# Patient Record
Sex: Female | Born: 1955
Health system: Southern US, Community
[De-identification: ages and names within clinical notes are randomized; demographics above are authoritative.]

## PROBLEM LIST (undated history)

## (undated) DIAGNOSIS — C801 Malignant (primary) neoplasm, unspecified: Secondary | ICD-10-CM

## (undated) DIAGNOSIS — R32 Unspecified urinary incontinence: Secondary | ICD-10-CM

## (undated) DIAGNOSIS — Z923 Personal history of irradiation: Secondary | ICD-10-CM

## (undated) DIAGNOSIS — K219 Gastro-esophageal reflux disease without esophagitis: Secondary | ICD-10-CM

## (undated) DIAGNOSIS — M810 Age-related osteoporosis without current pathological fracture: Secondary | ICD-10-CM

## (undated) DIAGNOSIS — I10 Essential (primary) hypertension: Secondary | ICD-10-CM

## (undated) DIAGNOSIS — E119 Type 2 diabetes mellitus without complications: Secondary | ICD-10-CM

## (undated) DIAGNOSIS — E7800 Pure hypercholesterolemia, unspecified: Secondary | ICD-10-CM

## (undated) HISTORY — DX: Type 2 diabetes mellitus without complications: E11.9

## (undated) HISTORY — DX: Age-related osteoporosis without current pathological fracture: M81.0

## (undated) HISTORY — DX: Malignant (primary) neoplasm, unspecified: C80.1

## (undated) HISTORY — DX: Unspecified urinary incontinence: R32

## (undated) HISTORY — DX: Pure hypercholesterolemia, unspecified: E78.00

## (undated) HISTORY — DX: Essential (primary) hypertension: I10

---

## 1999-04-18 ENCOUNTER — Other Ambulatory Visit: Admission: RE | Admit: 1999-04-18 | Discharge: 1999-04-18 | Payer: Self-pay | Admitting: *Deleted

## 1999-06-04 ENCOUNTER — Ambulatory Visit (HOSPITAL_COMMUNITY): Admission: RE | Admit: 1999-06-04 | Discharge: 1999-06-04 | Payer: Self-pay | Admitting: *Deleted

## 1999-06-04 ENCOUNTER — Encounter: Payer: Self-pay | Admitting: *Deleted

## 2000-07-11 ENCOUNTER — Ambulatory Visit (HOSPITAL_COMMUNITY): Admission: RE | Admit: 2000-07-11 | Discharge: 2000-07-11 | Payer: Self-pay | Admitting: Internal Medicine

## 2000-07-11 ENCOUNTER — Encounter: Payer: Self-pay | Admitting: Internal Medicine

## 2001-08-04 ENCOUNTER — Ambulatory Visit (HOSPITAL_COMMUNITY): Admission: RE | Admit: 2001-08-04 | Discharge: 2001-08-04 | Payer: Self-pay | Admitting: Internal Medicine

## 2001-08-04 ENCOUNTER — Encounter: Payer: Self-pay | Admitting: Internal Medicine

## 2001-08-24 ENCOUNTER — Ambulatory Visit (HOSPITAL_COMMUNITY): Admission: RE | Admit: 2001-08-24 | Discharge: 2001-08-24 | Payer: Self-pay | Admitting: Internal Medicine

## 2001-09-16 ENCOUNTER — Other Ambulatory Visit: Admission: RE | Admit: 2001-09-16 | Discharge: 2001-09-16 | Payer: Self-pay | Admitting: *Deleted

## 2002-08-06 ENCOUNTER — Ambulatory Visit (HOSPITAL_COMMUNITY): Admission: RE | Admit: 2002-08-06 | Discharge: 2002-08-06 | Payer: Self-pay | Admitting: Internal Medicine

## 2002-08-06 ENCOUNTER — Encounter: Payer: Self-pay | Admitting: Internal Medicine

## 2002-10-11 ENCOUNTER — Other Ambulatory Visit: Admission: RE | Admit: 2002-10-11 | Discharge: 2002-10-11 | Payer: Self-pay | Admitting: *Deleted

## 2003-08-15 ENCOUNTER — Encounter: Payer: Self-pay | Admitting: Internal Medicine

## 2003-08-15 ENCOUNTER — Ambulatory Visit (HOSPITAL_COMMUNITY): Admission: RE | Admit: 2003-08-15 | Discharge: 2003-08-15 | Payer: Self-pay | Admitting: Internal Medicine

## 2003-10-29 DIAGNOSIS — C801 Malignant (primary) neoplasm, unspecified: Secondary | ICD-10-CM

## 2003-10-29 DIAGNOSIS — Z923 Personal history of irradiation: Secondary | ICD-10-CM

## 2003-10-29 HISTORY — DX: Personal history of irradiation: Z92.3

## 2003-10-29 HISTORY — DX: Malignant (primary) neoplasm, unspecified: C80.1

## 2003-10-29 HISTORY — PX: BREAST LUMPECTOMY: SHX2

## 2003-10-29 HISTORY — PX: BREAST SURGERY: SHX581

## 2004-03-27 ENCOUNTER — Other Ambulatory Visit: Admission: RE | Admit: 2004-03-27 | Discharge: 2004-03-27 | Payer: Self-pay | Admitting: *Deleted

## 2004-08-28 ENCOUNTER — Ambulatory Visit (HOSPITAL_COMMUNITY): Admission: RE | Admit: 2004-08-28 | Discharge: 2004-08-28 | Payer: Self-pay | Admitting: Internal Medicine

## 2004-09-05 ENCOUNTER — Encounter: Admission: RE | Admit: 2004-09-05 | Discharge: 2004-09-05 | Payer: Self-pay | Admitting: Internal Medicine

## 2004-09-11 ENCOUNTER — Encounter (HOSPITAL_COMMUNITY): Admission: RE | Admit: 2004-09-11 | Discharge: 2004-12-10 | Payer: Self-pay | Admitting: Internal Medicine

## 2004-09-13 ENCOUNTER — Encounter: Admission: RE | Admit: 2004-09-13 | Discharge: 2004-09-13 | Payer: Self-pay | Admitting: Internal Medicine

## 2004-09-13 ENCOUNTER — Encounter (INDEPENDENT_AMBULATORY_CARE_PROVIDER_SITE_OTHER): Payer: Self-pay | Admitting: Radiology

## 2004-09-13 ENCOUNTER — Encounter (INDEPENDENT_AMBULATORY_CARE_PROVIDER_SITE_OTHER): Payer: Self-pay | Admitting: Specialist

## 2004-10-03 ENCOUNTER — Encounter: Admission: RE | Admit: 2004-10-03 | Discharge: 2004-10-03 | Payer: Self-pay | Admitting: General Surgery

## 2004-10-05 ENCOUNTER — Ambulatory Visit (HOSPITAL_BASED_OUTPATIENT_CLINIC_OR_DEPARTMENT_OTHER): Admission: RE | Admit: 2004-10-05 | Discharge: 2004-10-05 | Payer: Self-pay | Admitting: General Surgery

## 2004-10-05 ENCOUNTER — Encounter: Admission: RE | Admit: 2004-10-05 | Discharge: 2004-10-05 | Payer: Self-pay | Admitting: General Surgery

## 2004-10-05 ENCOUNTER — Encounter (INDEPENDENT_AMBULATORY_CARE_PROVIDER_SITE_OTHER): Payer: Self-pay | Admitting: *Deleted

## 2004-10-05 ENCOUNTER — Ambulatory Visit (HOSPITAL_COMMUNITY): Admission: RE | Admit: 2004-10-05 | Discharge: 2004-10-05 | Payer: Self-pay | Admitting: General Surgery

## 2004-10-12 ENCOUNTER — Ambulatory Visit: Payer: Self-pay | Admitting: Oncology

## 2004-10-16 ENCOUNTER — Ambulatory Visit (HOSPITAL_BASED_OUTPATIENT_CLINIC_OR_DEPARTMENT_OTHER): Admission: RE | Admit: 2004-10-16 | Discharge: 2004-10-16 | Payer: Self-pay | Admitting: General Surgery

## 2004-10-16 ENCOUNTER — Ambulatory Visit (HOSPITAL_COMMUNITY): Admission: RE | Admit: 2004-10-16 | Discharge: 2004-10-16 | Payer: Self-pay | Admitting: General Surgery

## 2004-10-16 ENCOUNTER — Encounter (INDEPENDENT_AMBULATORY_CARE_PROVIDER_SITE_OTHER): Payer: Self-pay | Admitting: Specialist

## 2004-11-05 ENCOUNTER — Ambulatory Visit (HOSPITAL_COMMUNITY): Admission: RE | Admit: 2004-11-05 | Discharge: 2004-11-05 | Payer: Self-pay | Admitting: Oncology

## 2004-11-06 ENCOUNTER — Ambulatory Visit (HOSPITAL_COMMUNITY): Admission: RE | Admit: 2004-11-06 | Discharge: 2004-11-06 | Payer: Self-pay | Admitting: Oncology

## 2004-11-09 ENCOUNTER — Ambulatory Visit: Admission: RE | Admit: 2004-11-09 | Discharge: 2005-01-18 | Payer: Self-pay | Admitting: Radiation Oncology

## 2004-12-24 ENCOUNTER — Encounter: Admission: RE | Admit: 2004-12-24 | Discharge: 2004-12-24 | Payer: Self-pay | Admitting: Internal Medicine

## 2005-02-18 ENCOUNTER — Ambulatory Visit: Payer: Self-pay | Admitting: Oncology

## 2005-02-21 ENCOUNTER — Ambulatory Visit: Admission: RE | Admit: 2005-02-21 | Discharge: 2005-02-21 | Payer: Self-pay | Admitting: Radiation Oncology

## 2005-08-26 ENCOUNTER — Ambulatory Visit: Payer: Self-pay | Admitting: Oncology

## 2005-08-30 ENCOUNTER — Encounter: Admission: RE | Admit: 2005-08-30 | Discharge: 2005-08-30 | Payer: Self-pay | Admitting: General Surgery

## 2005-09-11 ENCOUNTER — Encounter: Admission: RE | Admit: 2005-09-11 | Discharge: 2005-09-11 | Payer: Self-pay | Admitting: General Surgery

## 2005-09-11 ENCOUNTER — Ambulatory Visit (HOSPITAL_COMMUNITY): Admission: RE | Admit: 2005-09-11 | Discharge: 2005-09-11 | Payer: Self-pay | Admitting: Oncology

## 2006-02-20 ENCOUNTER — Ambulatory Visit: Payer: Self-pay | Admitting: Oncology

## 2006-02-21 LAB — CBC WITH DIFFERENTIAL/PLATELET
Basophils Absolute: 0 10*3/uL (ref 0.0–0.1)
EOS%: 2.1 % (ref 0.0–7.0)
Eosinophils Absolute: 0.1 10*3/uL (ref 0.0–0.5)
LYMPH%: 26.8 % (ref 14.0–48.0)
MCH: 32.3 pg (ref 26.0–34.0)
MCV: 92.7 fL (ref 81.0–101.0)
MONO%: 7.2 % (ref 0.0–13.0)
NEUT#: 3.2 10*3/uL (ref 1.5–6.5)
Platelets: 225 10*3/uL (ref 145–400)
RBC: 4.34 10*6/uL (ref 3.70–5.32)
RDW: 12.1 % (ref 11.3–14.5)

## 2006-02-21 LAB — COMPREHENSIVE METABOLIC PANEL
AST: 27 U/L (ref 0–37)
Alkaline Phosphatase: 50 U/L (ref 39–117)
BUN: 13 mg/dL (ref 6–23)
Glucose, Bld: 102 mg/dL — ABNORMAL HIGH (ref 70–99)
Potassium: 4 mEq/L (ref 3.5–5.3)
Sodium: 139 mEq/L (ref 135–145)
Total Bilirubin: 0.6 mg/dL (ref 0.3–1.2)

## 2006-09-05 ENCOUNTER — Ambulatory Visit: Payer: Self-pay | Admitting: Oncology

## 2006-09-09 LAB — COMPREHENSIVE METABOLIC PANEL
AST: 26 U/L (ref 0–37)
Albumin: 3.8 g/dL (ref 3.5–5.2)
Alkaline Phosphatase: 62 U/L (ref 39–117)
Potassium: 4.1 mEq/L (ref 3.5–5.3)
Sodium: 141 mEq/L (ref 135–145)
Total Bilirubin: 0.4 mg/dL (ref 0.3–1.2)
Total Protein: 6.8 g/dL (ref 6.0–8.3)

## 2006-09-09 LAB — CBC WITH DIFFERENTIAL/PLATELET
Basophils Absolute: 0 10*3/uL (ref 0.0–0.1)
EOS%: 2.1 % (ref 0.0–7.0)
MCH: 31.4 pg (ref 26.0–34.0)
MCV: 91.7 fL (ref 81.0–101.0)
MONO%: 6.9 % (ref 0.0–13.0)
RBC: 4.42 10*6/uL (ref 3.70–5.32)
RDW: 11.9 % (ref 11.3–14.5)

## 2006-09-09 LAB — CANCER ANTIGEN 27.29: CA 27.29: 15 U/mL (ref 0–39)

## 2006-09-25 ENCOUNTER — Encounter: Admission: RE | Admit: 2006-09-25 | Discharge: 2006-09-25 | Payer: Self-pay | Admitting: General Surgery

## 2007-03-06 ENCOUNTER — Ambulatory Visit: Payer: Self-pay | Admitting: Oncology

## 2007-03-10 LAB — CBC WITH DIFFERENTIAL/PLATELET
BASO%: 0.4 % (ref 0.0–2.0)
EOS%: 2.6 % (ref 0.0–7.0)
LYMPH%: 23.2 % (ref 14.0–48.0)
MCH: 31.9 pg (ref 26.0–34.0)
MCHC: 35.1 g/dL (ref 32.0–36.0)
MCV: 90.8 fL (ref 81.0–101.0)
MONO#: 0.3 10*3/uL (ref 0.1–0.9)
MONO%: 5.8 % (ref 0.0–13.0)
NEUT%: 68 % (ref 39.6–76.8)
Platelets: 236 10*3/uL (ref 145–400)
RBC: 4.39 10*6/uL (ref 3.70–5.32)
WBC: 5.9 10*3/uL (ref 3.9–10.0)

## 2007-03-10 LAB — COMPREHENSIVE METABOLIC PANEL
ALT: 27 U/L (ref 0–35)
AST: 27 U/L (ref 0–37)
Alkaline Phosphatase: 59 U/L (ref 39–117)
Creatinine, Ser: 0.49 mg/dL (ref 0.40–1.20)
Sodium: 140 mEq/L (ref 135–145)
Total Bilirubin: 0.4 mg/dL (ref 0.3–1.2)
Total Protein: 6.6 g/dL (ref 6.0–8.3)

## 2007-09-03 ENCOUNTER — Ambulatory Visit: Payer: Self-pay | Admitting: Oncology

## 2007-09-07 LAB — COMPREHENSIVE METABOLIC PANEL
ALT: 33 U/L (ref 0–35)
AST: 31 U/L (ref 0–37)
BUN: 11 mg/dL (ref 6–23)
CO2: 22 mEq/L (ref 19–32)
Calcium: 9.3 mg/dL (ref 8.4–10.5)
Chloride: 106 mEq/L (ref 96–112)
Creatinine, Ser: 0.54 mg/dL (ref 0.40–1.20)
Total Bilirubin: 0.5 mg/dL (ref 0.3–1.2)

## 2007-09-07 LAB — CBC WITH DIFFERENTIAL/PLATELET
BASO%: 0.9 % (ref 0.0–2.0)
Basophils Absolute: 0 10*3/uL (ref 0.0–0.1)
EOS%: 3 % (ref 0.0–7.0)
HCT: 40.8 % (ref 34.8–46.6)
HGB: 14.6 g/dL (ref 11.6–15.9)
LYMPH%: 25.8 % (ref 14.0–48.0)
MCH: 31.9 pg (ref 26.0–34.0)
MCHC: 35.7 g/dL (ref 32.0–36.0)
NEUT%: 64.5 % (ref 39.6–76.8)
Platelets: 234 10*3/uL (ref 145–400)
lymph#: 1.4 10*3/uL (ref 0.9–3.3)

## 2007-09-07 LAB — CANCER ANTIGEN 27.29: CA 27.29: 18 U/mL (ref 0–39)

## 2007-09-10 LAB — VITAMIN D PNL(25-HYDRXY+1,25-DIHY)-BLD: Vit D, 1,25-Dihydroxy: 88 pg/mL — ABNORMAL HIGH (ref 6–62)

## 2007-09-28 ENCOUNTER — Encounter: Admission: RE | Admit: 2007-09-28 | Discharge: 2007-09-28 | Payer: Self-pay | Admitting: Surgery

## 2007-10-04 ENCOUNTER — Encounter: Admission: RE | Admit: 2007-10-04 | Discharge: 2007-10-04 | Payer: Self-pay | Admitting: Surgery

## 2008-03-10 ENCOUNTER — Ambulatory Visit: Payer: Self-pay | Admitting: Oncology

## 2008-03-14 LAB — CBC WITH DIFFERENTIAL/PLATELET
Basophils Absolute: 0 10*3/uL (ref 0.0–0.1)
EOS%: 1.7 % (ref 0.0–7.0)
HCT: 40.7 % (ref 34.8–46.6)
HGB: 14.2 g/dL (ref 11.6–15.9)
MCH: 31.5 pg (ref 26.0–34.0)
MCV: 89.9 fL (ref 81.0–101.0)
MONO%: 5 % (ref 0.0–13.0)
NEUT%: 68 % (ref 39.6–76.8)

## 2008-03-14 LAB — COMPREHENSIVE METABOLIC PANEL
ALT: 22 U/L (ref 0–35)
Alkaline Phosphatase: 56 U/L (ref 39–117)
CO2: 23 mEq/L (ref 19–32)
Sodium: 140 mEq/L (ref 135–145)
Total Bilirubin: 0.4 mg/dL (ref 0.3–1.2)
Total Protein: 7.1 g/dL (ref 6.0–8.3)

## 2008-09-28 ENCOUNTER — Encounter: Admission: RE | Admit: 2008-09-28 | Discharge: 2008-09-28 | Payer: Self-pay | Admitting: Oncology

## 2009-03-13 ENCOUNTER — Ambulatory Visit: Payer: Self-pay | Admitting: Oncology

## 2009-03-15 LAB — CBC WITH DIFFERENTIAL/PLATELET
Eosinophils Absolute: 0.1 10*3/uL (ref 0.0–0.5)
MCV: 91.2 fL (ref 79.5–101.0)
MONO#: 0.3 10*3/uL (ref 0.1–0.9)
MONO%: 4.8 % (ref 0.0–14.0)
NEUT#: 4.6 10*3/uL (ref 1.5–6.5)
RBC: 4.55 10*6/uL (ref 3.70–5.45)
RDW: 12.2 % (ref 11.2–14.5)
WBC: 6.5 10*3/uL (ref 3.9–10.3)
lymph#: 1.4 10*3/uL (ref 0.9–3.3)

## 2009-03-16 LAB — COMPREHENSIVE METABOLIC PANEL
AST: 22 U/L (ref 0–37)
Albumin: 4 g/dL (ref 3.5–5.2)
Alkaline Phosphatase: 64 U/L (ref 39–117)
Chloride: 106 mEq/L (ref 96–112)
Glucose, Bld: 120 mg/dL — ABNORMAL HIGH (ref 70–99)
Potassium: 4.1 mEq/L (ref 3.5–5.3)
Sodium: 140 mEq/L (ref 135–145)
Total Protein: 6.9 g/dL (ref 6.0–8.3)

## 2009-03-16 LAB — VITAMIN D 25 HYDROXY (VIT D DEFICIENCY, FRACTURES): Vit D, 25-Hydroxy: 31 ng/mL (ref 30–89)

## 2009-03-30 LAB — RESEARCH LABS

## 2009-09-05 ENCOUNTER — Encounter: Admission: RE | Admit: 2009-09-05 | Discharge: 2009-09-05 | Payer: Self-pay | Admitting: Oncology

## 2009-10-02 ENCOUNTER — Encounter: Admission: RE | Admit: 2009-10-02 | Discharge: 2009-10-02 | Payer: Self-pay | Admitting: Internal Medicine

## 2009-10-26 ENCOUNTER — Ambulatory Visit: Payer: Self-pay | Admitting: Oncology

## 2009-10-31 LAB — CBC WITH DIFFERENTIAL/PLATELET
BASO%: 0.5 % (ref 0.0–2.0)
Basophils Absolute: 0 10*3/uL (ref 0.0–0.1)
EOS%: 2.2 % (ref 0.0–7.0)
Eosinophils Absolute: 0.1 10*3/uL (ref 0.0–0.5)
HCT: 42.6 % (ref 34.8–46.6)
HGB: 14.8 g/dL (ref 11.6–15.9)
LYMPH%: 28.6 % (ref 14.0–49.7)
MCH: 32.3 pg (ref 25.1–34.0)
MCHC: 34.8 g/dL (ref 31.5–36.0)
MCV: 92.7 fL (ref 79.5–101.0)
MONO#: 0.4 10*3/uL (ref 0.1–0.9)
MONO%: 5.9 % (ref 0.0–14.0)
NEUT#: 3.8 10*3/uL (ref 1.5–6.5)
NEUT%: 62.8 % (ref 38.4–76.8)
Platelets: 245 10*3/uL (ref 145–400)
RBC: 4.6 10*6/uL (ref 3.70–5.45)
RDW: 12.6 % (ref 11.2–14.5)
WBC: 6 10*3/uL (ref 3.9–10.3)
lymph#: 1.7 10*3/uL (ref 0.9–3.3)

## 2009-10-31 LAB — RESEARCH LABS

## 2009-11-01 LAB — COMPREHENSIVE METABOLIC PANEL
ALT: 19 U/L (ref 0–35)
AST: 22 U/L (ref 0–37)
Albumin: 4.2 g/dL (ref 3.5–5.2)
Alkaline Phosphatase: 66 U/L (ref 39–117)
BUN: 15 mg/dL (ref 6–23)
CO2: 27 mEq/L (ref 19–32)
Calcium: 9.2 mg/dL (ref 8.4–10.5)
Chloride: 104 mEq/L (ref 96–112)
Creatinine, Ser: 0.45 mg/dL (ref 0.40–1.20)
Glucose, Bld: 100 mg/dL — ABNORMAL HIGH (ref 70–99)
Potassium: 4 mEq/L (ref 3.5–5.3)
Sodium: 141 mEq/L (ref 135–145)
Total Bilirubin: 0.3 mg/dL (ref 0.3–1.2)
Total Protein: 6.8 g/dL (ref 6.0–8.3)

## 2009-11-01 LAB — CANCER ANTIGEN 27.29: CA 27.29: 15 U/mL (ref 0–39)

## 2009-11-01 LAB — VITAMIN D 25 HYDROXY (VIT D DEFICIENCY, FRACTURES): Vit D, 25-Hydroxy: 43 ng/mL (ref 30–89)

## 2009-12-11 ENCOUNTER — Encounter (INDEPENDENT_AMBULATORY_CARE_PROVIDER_SITE_OTHER): Payer: Self-pay | Admitting: *Deleted

## 2010-02-23 ENCOUNTER — Encounter (INDEPENDENT_AMBULATORY_CARE_PROVIDER_SITE_OTHER): Payer: Self-pay | Admitting: *Deleted

## 2010-02-28 ENCOUNTER — Ambulatory Visit: Payer: Self-pay | Admitting: Internal Medicine

## 2010-03-13 ENCOUNTER — Ambulatory Visit: Payer: Self-pay | Admitting: Internal Medicine

## 2010-10-08 ENCOUNTER — Encounter
Admission: RE | Admit: 2010-10-08 | Discharge: 2010-10-08 | Payer: Self-pay | Source: Home / Self Care | Attending: Internal Medicine | Admitting: Internal Medicine

## 2010-11-27 NOTE — Procedures (Signed)
Summary: Colonoscopy  Patient: [name] Note: All result statuses are Final unless otherwise noted.  Tests: (1) Colonoscopy (COL)   COL Colonoscopy           DONE     [facility]     [address]     [place], [place]  [address]           COLONOSCOPY PROCEDURE REPORT           PATIENT:  Andrea [name], Andrea [name]  MR#:  [id number]     BIRTHDATE:  [birth year], 54 yrs. old  GENDER:  female     ENDOSCOPIST:  Hedwig Morton. Juanda Chance, MD     REF. BY:  Chilton Greathouse, M.D.     PROCEDURE DATE:  03/13/2010     PROCEDURE:  Colonoscopy 19147     ASA CLASS:  Class I     INDICATIONS:  Routine Risk Screening     MEDICATIONS:   Versed 9 mg, Fentanyl 100 mcg           DESCRIPTION OF PROCEDURE:   After the risks benefits and     alternatives of the procedure were thoroughly explained, informed     consent was obtained.  Digital rectal exam was performed and     revealed no rectal masses.   The LB PCF-Q180AL [id number] endoscope     was introduced through the anus and advanced to the cecum, which     was identified by both the appendix and ileocecal valve, without     limitations.  The quality of the prep was good, using MiraLax.     The instrument was then slowly withdrawn as the colon was fully     examined.     <<PROCEDUREIMAGES>>           FINDINGS:  Mild diverticulosis was found (see image3).  This was     otherwise a normal examination of the colon (see image1, image2,     and image4).   Retroflexed views in the rectum revealed no     abnormalities.    The scope was then withdrawn from the patient     and the procedure completed.           COMPLICATIONS:  None     ENDOSCOPIC IMPRESSION:     1) Mild diverticulosis     2) Otherwise normal examination     RECOMMENDATIONS:     1) high fiber diet     REPEAT EXAM:  In 10 year(s) for.           ______________________________     Hedwig Morton. Juanda Chance, MD           CC:           n.     eSIGNED:   Hedwig Morton. Uzair Godley at 03/13/2010 11:11 AM           Paden, Kuras, [id number]  Note: An exclamation mark (!) indicates a result that was not dispersed into the flowsheet. Document Creation Date: 03/13/2010 11:12 AM _______________________________________________________________________  (1) Order result status: Final Collection or observation date-time: 03/13/2010 11:07 Requested date-time:  Receipt date-time:  Reported date-time:  Referring Physician:   Ordering Physician: Lina Sar [phone]) Specimen Source:  Source: Launa Grill Order Number: [id number] Lab site:   Appended Document: Colonoscopy    Clinical Lists Changes  Observations: Added new observation of COLONNXTDUE: 02/2020 (03/13/2010 13:03)

## 2010-11-27 NOTE — Procedures (Signed)
Summary: Colonoscopy  Patient: [name] Note: All result statuses are Final unless otherwise noted.  Tests: (1) Colonoscopy (COL)   COL Colonoscopy           DONE     [facility]     [address]     [place], [place]  [address]           COLONOSCOPY PROCEDURE REPORT           PATIENT:  Andrea [name], Andrea [name]  MR#:  [id number]     BIRTHDATE:  09-Dec-1955, 54 yrs. old  GENDER:  female     ENDOSCOPIST:  Hedwig Morton. Juanda Chance, MD     REF. BY:  Chilton Greathouse, M.D.     PROCEDURE DATE:  03/13/2010     PROCEDURE:  Colonoscopy 52841     ASA CLASS:  Class I     INDICATIONS:  Routine Risk Screening     MEDICATIONS:   Versed 9 mg, Fentanyl 100 mcg           DESCRIPTION OF PROCEDURE:   After the risks benefits and     alternatives of the procedure were thoroughly explained, informed     consent was obtained.  Digital rectal exam was performed and     revealed no rectal masses.   The LB PCF-Q180AL [id number] endoscope     was introduced through the anus and advanced to the cecum, which     was identified by both the appendix and ileocecal valve, without     limitations.  The quality of the prep was good, using MiraLax.     The instrument was then slowly withdrawn as the colon was fully     examined.     <<PROCEDUREIMAGES>>           FINDINGS:  Mild diverticulosis was found (see image3).  This was     otherwise a normal examination of the colon (see image1, image2,     and image4).   Retroflexed views in the rectum revealed no     abnormalities.    The scope was then withdrawn from the patient     and the procedure completed.           COMPLICATIONS:  None     ENDOSCOPIC IMPRESSION:     1) Mild diverticulosis     2) Otherwise normal examination     RECOMMENDATIONS:     1) high fiber diet     REPEAT EXAM:  In 10 year(s) for.           ______________________________     Hedwig Morton. Juanda Chance, MD           CC:           n.     eSIGNED:   Hedwig Morton. Arissa Fagin at 03/13/2010 11:11 AM           Zuriel, Yeaman, [id number]  Note: An exclamation mark (!) indicates a result that was not dispersed into the flowsheet. Document Creation Date: 03/15/2010 5:07 PM _______________________________________________________________________  (1) Order result status: Final Collection or observation date-time: 03/13/2010 11:07 Requested date-time:  Receipt date-time:  Reported date-time:  Referring Physician:   Ordering Physician: Lina Sar [phone]) Specimen Source:  Source: Launa Grill Order Number: [id number] Lab site:

## 2010-11-27 NOTE — Letter (Signed)
Summary: [name] Instructions  [facility]  [address] [place], [place] [address]   Phone: [phone]  Fax: [phone]       Andrea [name]    03-Feb-1956    MRN: [id number]       Procedure Day [name]:  [name]  03/13/10     Arrival Time: 9:30AM     Procedure Time:  10:30AM     Location of Procedure:                    _ X_  [facility] (4th Floor)    PREPARATION FOR COLONOSCOPY WITH MIRALAX  Starting 5 days prior to your procedure 03/08/10 do not eat nuts, seeds, popcorn, corn, beans, peas,  salads, or any raw vegetables.  Do not take any fiber supplements (e.g. Metamucil, Citrucel, and Benefiber). ____________________________________________________________________________________________________   THE DAY BEFORE YOUR PROCEDURE         DATE: 03/12/10   DAY: MONDAY  1   Drink clear liquids the entire day-NO SOLID FOOD  2   Do not drink anything colored red or purple.  Avoid juices with pulp.  No orange juice.  3   Drink at least 64 oz. (8 glasses) of fluid/clear liquids during the day to prevent dehydration and help the prep work efficiently.  CLEAR LIQUIDS INCLUDE: Water Jello Ice Popsicles Tea (sugar ok, no milk/cream) Powdered fruit flavored drinks Coffee (sugar ok, no milk/cream) Gatorade Juice: apple, white grape, white cranberry  Lemonade Clear bullion, consomm, broth Carbonated beverages (any kind) Strained chicken noodle soup Hard Candy  4   Mix the entire bottle of Miralax with 64 oz. of Gatorade/Powerade in the morning and put in the refrigerator to chill.  5   At 3:00 pm take 2 Dulcolax/Bisacodyl tablets.  6   At 4:30 pm take one Reglan/Metoclopramide tablet.  7  Starting at 5:00 pm drink one 8 oz glass of the Miralax mixture every 15-20 minutes until you have finished drinking the entire 64 oz.  You should finish drinking prep around 7:30 or 8:00 pm.  8   If you are nauseated, you may take the 2nd  Reglan/Metoclopramide tablet at 6:30 pm.        9    At 8:00 pm take 2 more DULCOLAX/Bisacodyl tablets.     THE DAY OF YOUR PROCEDURE      DATE:  03/13/10  DAY: [name]  You may drink clear liquids until 8:30AM  (2 HOURS BEFORE PROCEDURE).   MEDICATION INSTRUCTIONS  Unless otherwise instructed, you should take regular prescription medications with a small sip of water as early as possible the morning of your procedure.           OTHER INSTRUCTIONS  You will need a responsible adult at least 53 years of age to accompany you and drive you home.   This person must remain in the waiting room during your procedure.  Wear loose fitting clothing that is easily removed.  Leave jewelry and other valuables at home.  However, you may wish to bring a book to read or an iPod/MP3 player to listen to music as you wait for your procedure to start.  Remove all body piercing jewelry and leave at home.  Total time from sign-in until discharge is approximately 2-3 hours.  You should go home directly after your procedure and rest.  You can resume normal activities the day after your procedure.  The day of your procedure you should not:  Drive   Make legal decisions   Operate machinery   Drink alcohol   Return to work  You will receive specific instructions about eating, activities and medications before you leave.   The above instructions have been reviewed and explained to me by   Wyona Almas RN  Feb 28, 2010 11:17 AM     I fully understand and can verbalize these instructions _____________________________ Date _______

## 2010-11-27 NOTE — Letter (Signed)
Summary: Previsit letter  [facility]  [address] [place], [place] [address]   Phone: [phone]  Fax: [phone]       12/11/2009 MRN: [id number]  [facility] [address] [place], [place]  [address]  Dear Andrea [name],  Welcome to the Gastroenterology Division at [facility].    You are scheduled to see a nurse for your pre-procedure visit on 01-01-10 at 10:30a.m. on the 3rd floor at [facility], [address]  We ask that you try to arrive at our office 15 minutes prior to your appointment time to allow for check-in.  Your nurse visit will consist of discussing your medical and surgical history, your immediate family medical history, and your medications.    Please bring a complete list of all your medications or, if you prefer, bring the medication bottles and we will list them.  We will need to be aware of both prescribed and over the counter drugs.  We will need to know exact dosage information as well.  If you are on blood thinners (Coumadin, Plavix, Aggrenox, Ticlid, etc.) please call our office today/prior to your appointment, as we need to consult with your physician about holding your medication.   Please be prepared to read and sign documents such as consent forms, a financial agreement, and acknowledgement forms.  If necessary, and with your consent, a friend or relative is welcome to sit-in on the nurse visit with you.  Please bring your insurance card so that we may make a copy of it.  If your insurance requires a referral to see a specialist, please bring your referral form from your primary care physician.  No co-pay is required for this nurse visit.     If you cannot keep your appointment, please call [phone] to cancel or reschedule prior to your appointment date.  This allows [place] the opportunity to schedule an appointment for another patient in need of care.    Thank you for choosing [facility] for your  medical needs.  We appreciate the opportunity to care for you.  Please visit [place] at our website  to learn more about our practice.                     Sincerely.                                                                                                                   The Gastroenterology Division

## 2010-11-27 NOTE — Miscellaneous (Signed)
Summary: [facility] Previsit/prep  Clinical Lists Changes  Medications: Added new medication of DULCOLAX 5 MG  TBEC (BISACODYL) Day before procedure take 2 at 3pm and 2 at 8pm. - Signed Added new medication of METOCLOPRAMIDE HCL 10 MG  TABS (METOCLOPRAMIDE HCL) As per prep instructions. - Signed Rx of DULCOLAX 5 MG  TBEC (BISACODYL) Day before procedure take 2 at 3pm and 2 at 8pm.;  #4 x 0;  Signed;  Entered by: Wyona Almas RN;  Authorized by: Hart Carwin MD;  Method used: Electronically to [facility]*, [address], [address] Shipping/mailing, [place], [place]  [address], Ph: [phone], Fax: [phone] Rx of METOCLOPRAMIDE HCL 10 MG  TABS (METOCLOPRAMIDE HCL) As per prep instructions.;  #2 x 0;  Signed;  Entered by: Wyona Almas RN;  Authorized by: Hart Carwin MD;  Method used: Electronically to [facility]*, [address], [address] Shipping/mailing, [place], [place]  [address], Ph: [phone], Fax: [phone] Observations: Added new observation of NKA: T (02/28/2010 10:37)    Prescriptions: METOCLOPRAMIDE HCL 10 MG  TABS (METOCLOPRAMIDE HCL) As per prep instructions.  #2 x 0   Entered by:   Wyona Almas RN   Authorized by:   Hart Carwin MD   Signed by:   Wyona Almas RN on 02/28/2010   Method used:   Electronically to        [facility]* (retail)       [address]       [address] Shipping/mailing       [place], [place]  [address]       Ph: [phone]       Fax: [phone]   RxID:   [id number] DULCOLAX 5 MG  TBEC (BISACODYL) Day before procedure take 2 at 3pm and 2 at 8pm.  #4 x 0   Entered by:   Wyona Almas RN   Authorized by:   Hart Carwin MD   Signed by:   Wyona Almas RN on 02/28/2010   Method used:   Electronically to        [facility]* (retail)       [address]       [address] Shipping/mailing       [place], [place]  [address]       Ph: [phone]       Fax: [phone]   RxID:   [id number]

## 2011-02-07 ENCOUNTER — Other Ambulatory Visit: Payer: Self-pay | Admitting: Obstetrics and Gynecology

## 2011-03-15 NOTE — Op Note (Signed)
Andrea [name], Andrea [name]            ACCOUNT NO.:  [id number]   MEDICAL RECORD NO.:  [id number]          PATIENT TYPE:  AMB   LOCATION:  DSC                          FACILITY:  [facility]   PHYSICIAN:  Rose Phi. Maple Hudson, M.D.   DATE OF BIRTH:  1956/10/24   DATE OF PROCEDURE:  10/16/2004  DATE OF DISCHARGE:                                 OPERATIVE REPORT   PREOPERATIVE DIAGNOSIS:  T2 carcinoma of the right breast.   POSTOPERATIVE DIAGNOSIS:  T2 carcinoma of the right breast.   PROCEDURE:  Reexcision of lumpectomy site on the right.   SURGEON:  Rose Phi. Maple Hudson, M.D.   ANESTHESIA:  General.   INDICATIONS:  This patient had presented with a large palpable mass and we  had done a previous lumpectomy and sentinel node biopsy.  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   DESCRIPTION OF PROCEDURE:  After suitable general anesthesia was induced,  the patient was placed in the supine position with the arms extended on the  arm board, and the right breast prepped and draped in the usual fashion.  Beginning in the medial portion of her previous incision and extending  laterally and inferiorly, we incised this and entered the seroma cavity  which I thoroughly irrigated.  I then excised the lateral and inferior  margin which is the area of concern.  This was very normal looking breast  tissue and I used methylene blue to mark and ink the new margin.   Hemostasis was obtained with the cautery.   We thoroughly irrigated the field with saline and then closed it in two  layers with 2-0 Vicryl and subcuticular 4-0 Monocryl and Steri-Strips.  Dressings applied.  The patient was then transferred to the recovery room in  satisfactory condition having tolerated the procedure well.      Pete   PRY/MEDQ  D:  10/16/2004  T:  10/16/2004  Job:  [id number]

## 2011-04-22 ENCOUNTER — Other Ambulatory Visit: Payer: Self-pay | Admitting: Internal Medicine

## 2011-04-22 DIAGNOSIS — Z9889 Other specified postprocedural states: Secondary | ICD-10-CM

## 2011-08-07 ENCOUNTER — Other Ambulatory Visit: Payer: Self-pay | Admitting: Internal Medicine

## 2011-08-07 DIAGNOSIS — Z853 Personal history of malignant neoplasm of breast: Secondary | ICD-10-CM

## 2011-09-09 ENCOUNTER — Ambulatory Visit
Admission: RE | Admit: 2011-09-09 | Discharge: 2011-09-09 | Disposition: A | Discharge status: Home / Self Care | Payer: BC Managed Care – PPO | Source: Ambulatory Visit | Attending: Internal Medicine | Admitting: Internal Medicine

## 2011-10-14 ENCOUNTER — Ambulatory Visit
Admission: RE | Admit: 2011-10-14 | Discharge: 2011-10-14 | Disposition: A | Discharge status: Home / Self Care | Payer: BC Managed Care – PPO | Source: Ambulatory Visit | Attending: Internal Medicine | Admitting: Internal Medicine

## 2011-10-14 DIAGNOSIS — Z9889 Other specified postprocedural states: Secondary | ICD-10-CM

## 2012-06-08 ENCOUNTER — Other Ambulatory Visit: Payer: Self-pay | Admitting: Internal Medicine

## 2012-06-08 DIAGNOSIS — Z9889 Other specified postprocedural states: Secondary | ICD-10-CM

## 2012-06-08 DIAGNOSIS — Z1231 Encounter for screening mammogram for malignant neoplasm of breast: Secondary | ICD-10-CM

## 2012-10-14 ENCOUNTER — Ambulatory Visit
Admission: RE | Admit: 2012-10-14 | Discharge: 2012-10-14 | Disposition: A | Discharge status: Home / Self Care | Payer: BC Managed Care – PPO | Source: Ambulatory Visit | Attending: Internal Medicine | Admitting: Internal Medicine

## 2012-10-14 DIAGNOSIS — Z9889 Other specified postprocedural states: Secondary | ICD-10-CM

## 2012-10-14 DIAGNOSIS — Z1231 Encounter for screening mammogram for malignant neoplasm of breast: Secondary | ICD-10-CM

## 2013-03-08 ENCOUNTER — Other Ambulatory Visit: Payer: Self-pay

## 2013-03-08 DIAGNOSIS — Z1231 Encounter for screening mammogram for malignant neoplasm of breast: Secondary | ICD-10-CM

## 2013-03-29 ENCOUNTER — Encounter: Payer: Self-pay | Admitting: Obstetrics and Gynecology

## 2013-04-12 ENCOUNTER — Encounter: Payer: Self-pay | Admitting: Obstetrics and Gynecology

## 2013-04-12 ENCOUNTER — Ambulatory Visit (INDEPENDENT_AMBULATORY_CARE_PROVIDER_SITE_OTHER): Payer: BC Managed Care – PPO | Admitting: Obstetrics and Gynecology

## 2013-04-12 VITALS — BP 120/70 | HR 64 | Ht <= 58 in | Wt 124.0 lb

## 2013-04-12 DIAGNOSIS — R82998 Other abnormal findings in urine: Secondary | ICD-10-CM

## 2013-04-12 DIAGNOSIS — R8281 Pyuria: Secondary | ICD-10-CM

## 2013-04-12 DIAGNOSIS — R81 Glycosuria: Secondary | ICD-10-CM

## 2013-04-12 DIAGNOSIS — Z0000 Encounter for general adult medical examination without abnormal findings: Secondary | ICD-10-CM

## 2013-04-12 DIAGNOSIS — Z01419 Encounter for gynecological examination (general) (routine) without abnormal findings: Secondary | ICD-10-CM

## 2013-04-12 DIAGNOSIS — N950 Postmenopausal bleeding: Secondary | ICD-10-CM

## 2013-04-12 LAB — POCT URINALYSIS DIPSTICK
Glucose, UA: 50
Nitrite, UA: NEGATIVE
Urobilinogen, UA: NEGATIVE

## 2013-04-12 NOTE — Progress Notes (Signed)
Patient ID: Andrea [name], female   DOB: 1956/10/17, 57 y.o.   MRN: [id number] 57 y.o.   Married    Asian   female   G1P1001   here for annual exam.   History of stress and urge incontinence.  Considering surgery.  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  Has nocturia.  Can get up every 2 hours at night.  No problems with frequent urination during the day. Detrol in the past and had dry mouth.  Took Oxytrol over the counter and had chest pressure type symptoms.  Felt anxious and nervous when she took it.   Voids well.   Denies pain with urination.  Last UTI one to two years ago.   Had spotting with wiping and on pad 1 -2 weeks ago.   LMP over one year ago.   History of breast cancer.  Status post lumpectomy and XRT. Took tamoxifen for 5 years, last in 2011.  Also took Evista for osteoporosis but stopped due to bone pain.  Has slightly high fasting glucose levels around 118.  No LMP recorded. Patient is postmenopausal.          Sexually active: no  The current method of family planning is abstinence.    Exercising: walking, stationary bike Last mammogram:  09/2012 Last pap smear: 03/2012 and negative high risk HPV. History of abnormal pap: no  Smoking: none Alcohol:none Last colonoscopy: 2012, normal.  States has mild diverticulosis. Last Bone Density:  09/09/11 - osteoporosis - T score of spine - 3.1, T score of hip - 2.8 - both unchanged from 2010.  Due in 2014.   Last tetanus shot: UTD Last cholesterol check: 2013   Hgb:    PCP         Urine:  Trace leukocytes and positive glucose.     Family History  Problem Relation Age of Onset  . Heart disease Mother   . Diabetes Father   . Breast cancer Sister   . Hypertension Brother     There are no active problems to display for this patient.   Past Medical History  Diagnosis Date  . Cancer 2005    breast cancer, right  . Hypertension   . Osteoporosis   . Urinary incontinence     Past Surgical History   Procedure Laterality Date  . Breast surgery Right 2005    lumpectomy  . Cesarean section  1991    Allergies: Review of patient's allergies indicates no known allergies.  Current Outpatient Prescriptions  Medication Sig Dispense Refill  . aspirin EC 81 MG tablet Take 81 mg by mouth daily.      . Cholecalciferol (VITAMIN D) 2000 UNITS CAPS Take 1 capsule by mouth daily.      . TOPROL XL 100 MG 24 hr tablet Take 1 tablet by mouth daily.       No current facility-administered medications for this visit.    ROS: Pertinent items are noted in HPI.  Social Hx:  Engineer, civil (consulting).   Exam:    BP 120/70  Pulse 64  Ht 4\' 10"  (1.473 m)  Wt 124 lb (56.246 kg)  BMI 25.92 kg/m2   Wt Readings from Last 3 Encounters:  04/12/13 124 lb (56.246 kg)     Ht Readings from Last 3 Encounters:  04/12/13 4\' 10"  (1.473 m)    General appearance: alert, cooperative and appears stated age Head: Normocephalic, without obvious abnormality, atraumatic Neck: no adenopathy, supple, symmetrical, trachea midline and thyroid  not enlarged, symmetric, no tenderness/mass/nodules Lungs: clear to auscultation bilaterally Breasts: Inspection negative.  Right breast with right upper outer quadrant scar.  No nipple retraction or dimpling, No nipple discharge or bleeding, No axillary or supraclavicular adenopathy, Normal to palpation without dominant masses Heart: regular rate and rhythm Abdomen: Pfannenstiel incision. Soft, non-tender; no masses,  no organomegaly Extremities: extremities normal, atraumatic, no cyanosis or edema Skin: Skin color, texture, turgor normal. No rashes or lesions Lymph nodes: Cervical, supraclavicular, and axillary nodes normal. No abnormal inguinal nodes palpated Neurologic: Grossly normal   Pelvic: External genitalia:  no lesions              Urethra:  normal appearing urethra with no masses, tenderness or lesions              Bartholins and Skenes: normal                 Vagina: normal  appearing vagina with normal color and discharge, no lesions              Cervix: normal appearance.  Os appears stenotic.              Pap taken: no        Bimanual Exam:  Uterus:  uterus is normal size, shape, consistency and nontender                                      Adnexa: normal adnexa in size, nontender and no masses                                      Rectovaginal: Confirms                                      Anus:  normal sphincter tone, no lesions  A: Postmenopausal bleeding. History of breast cancer.  Status post Tamoxifen treatment. Pyuria. Glucosuria. Osteoporosis. Mixed urinary incontinence.     P: mammogram yearly. pap smear in 2018. Return for pelvic ultrasound, possible saline ultrasound and endometrial biopsy. Urine culture. Hemoglobin A1C. Will have next bone density with PCP at end of this year.  I discussed midurethral slings and mesh use with patient. return annually or prn     An After Visit Summary was printed and given to the patient.

## 2013-04-12 NOTE — Patient Instructions (Signed)
EXERCISE AND DIET:  We recommended that you start or continue a regular exercise program for good health. Regular exercise means any activity that makes your heart beat faster and makes you sweat.  We recommend exercising at least 30 minutes per day at least 3 days a week, preferably 4 or 5.  We also recommend a diet low in fat and sugar.  Inactivity, poor dietary choices and obesity can cause diabetes, heart attack, stroke, and kidney damage, among others.    ALCOHOL AND SMOKING:  Women should limit their alcohol intake to no more than 7 drinks/beers/glasses of wine (combined, not each!) per week. Moderation of alcohol intake to this level decreases your risk of breast cancer and liver damage. And of course, no recreational drugs are part of a healthy lifestyle.  And absolutely no smoking or even second hand smoke. Most people know smoking can cause heart and lung diseases, but did you know it also contributes to weakening of your bones? Aging of your skin?  Yellowing of your teeth and nails?  CALCIUM AND VITAMIN D:  Adequate intake of calcium and Vitamin D are recommended.  The recommendations for exact amounts of these supplements seem to change often, but generally speaking 600 mg of calcium (either carbonate or citrate) and 800 units of Vitamin D per day seems prudent. Certain women may benefit from higher intake of Vitamin D.  If you are among these women, your doctor will have told you during your visit.    PAP SMEARS:  Pap smears, to check for cervical cancer or precancers,  have traditionally been done yearly, although recent scientific advances have shown that most women can have pap smears less often.  However, every woman still should have a physical exam from her gynecologist every year. It will include a breast check, inspection of the vulva and vagina to check for abnormal growths or skin changes, a visual exam of the cervix, and then an exam to evaluate the size and shape of the uterus and  ovaries.  And after 55 years of age, a rectal exam is indicated to check for rectal cancers. We will also provide age appropriate advice regarding health maintenance, like when you should have certain vaccines, screening for sexually transmitted diseases, bone density testing, colonoscopy, mammograms, etc.   MAMMOGRAMS:  All women over 40 years old should have a yearly mammogram. Many facilities now offer a "3D" mammogram, which may cost around $50 extra out of pocket. If possible,  we recommend you accept the option to have the 3D mammogram performed.  It both reduces the number of women who will be called back for extra views which then turn out to be normal, and it is better than the routine mammogram at detecting truly abnormal areas.    COLONOSCOPY:  Colonoscopy to screen for colon cancer is recommended for all women at age 50.  We know, you hate the idea of the prep.  We agree, BUT, having colon cancer and not knowing it is worse!!  Colon cancer so often starts as a polyp that can be seen and removed at colonscopy, which can quite literally save your life!  And if your first colonoscopy is normal and you have no family history of colon cancer, most women don't have to have it again for 10 years.  Once every ten years, you can do something that may end up saving your life, right?  We will be happy to help you get it scheduled when you are ready.    Be sure to check your insurance coverage so you understand how much it will cost.  It may be covered as a preventative service at no cost, but you should check your particular policy.    Postmenopausal Bleeding Menopause is commonly referred to as the "change in life." It is a time when the fertile years, the time of ovulating and having menstrual periods, has come to an end. It is also determined by not having menstrual periods for 12 months.  Postmenopausal bleeding is any bleeding a woman has after she has entered into menopause. Any type of postmenopausal  bleeding, even if it appears to be a typical menstrual period, is concerning. This should be evaluated by your caregiver.  CAUSES   Hormone therapy.  Cancer of the cervix or cancer of the lining of the uterus (endometrial cancer).  Thinning of the uterine lining (uterine atrophy).  Thyroid diseases.  Certain medicines.  Infection of the uterus or cervix.  Inflammation or irritation of the uterine lining (endometritis).  Estrogen-secreting tumors.  Growths (polyps) on the cervix, uterine lining, or uterus.  Uterine tumors (fibroids).  Being very overweight (obese). DIAGNOSIS  Your caregiver will take a medical history and ask questions. A physical exam will also be performed. Further tests may include:   A transvaginal ultrasound. An ultrasound wand or probe is inserted into your vagina to view the pelvic organs.  A biopsy of the lining of the uterus (endometrium). A sample of the endometrium is removed and examined.  A hysteroscopy. Your caregiver may use an instrument with a light and a camera attached to it (hysteroscope). The hysteroscope is used to look inside the uterus for problems.  A dilation and curettage (D&C). Tissue is removed from the uterine lining to be examined for problems. TREATMENT  Treatment depends on the cause of the bleeding. Some treatments include:   Surgery.  Medicines.  Hormones.  A hysteroscopy or D&C to remove polyps or fibroids.  Changing or stopping a current medicine you are taking. Talk to your caregiver about your specific treatment. HOME CARE INSTRUCTIONS   Maintain a healthy weight.  Keep regular pelvic exams and Pap tests. SEEK MEDICAL CARE IF:   You have bleeding, even if it is light in comparison to your previous periods.  Your bleeding lasts more than 1 week.  You have abdominal pain.  You develop bleeding with sexual intercourse. SEEK IMMEDIATE MEDICAL CARE IF:   You have a fever, chills, headache, dizziness,  muscle aches, and bleeding.  You have severe pain with bleeding.  You are passing blood clots.  You have bleeding and need more than 1 pad an hour.  You feel faint. MAKE SURE YOU:  Understand these instructions.  Will watch your condition.  Will get help right away if you are not doing well or get worse. Document Released: 01/22/2006 Document Revised: 01/06/2012 Document Reviewed: 06/20/2011 [facility] Patient Information 2014 [place], [place].

## 2013-04-14 LAB — CULTURE, URINE COMPREHENSIVE: Colony Count: 70000

## 2013-04-15 ENCOUNTER — Other Ambulatory Visit: Payer: Self-pay | Admitting: Obstetrics and Gynecology

## 2013-04-15 ENCOUNTER — Telehealth: Payer: Self-pay

## 2013-04-15 DIAGNOSIS — N390 Urinary tract infection, site not specified: Secondary | ICD-10-CM

## 2013-04-15 MED ORDER — NITROFURANTOIN MONOHYD MACRO 100 MG PO CAPS: 100.0000 mg | ORAL_CAPSULE | Freq: Two times a day (BID) | ORAL | Status: DC

## 2013-04-15 NOTE — Telephone Encounter (Signed)
LMOVM to call for test results. 

## 2013-04-15 NOTE — Telephone Encounter (Signed)
Message copied by Alphonsa Overall on Thu Apr 15, 2013  8:36 AM ------      Message from: Conley Simmonds      Created: Thu Apr 15, 2013  5:43 AM       Please contact patient regarding E Coli UTI.      I will place an order in Epic regarding Rx for Macrobid.            Patient will need a test of cure urine culture in 10 days by lab visit only.        I will place an order in Epic also for this.            Patient already informed about the elevated hemoglobin A1C. ------

## 2013-04-16 ENCOUNTER — Telehealth: Payer: Self-pay | Admitting: Obstetrics and Gynecology

## 2013-04-16 NOTE — Telephone Encounter (Signed)
LMTCB to discuss insurance benefits for PUS-SHGM

## 2013-04-16 NOTE — Telephone Encounter (Signed)
Patient notified of UTI.  She will begin Macrobid and made lab appt. For repeat urine culture 05-03-13 for  TOC DX 599.0.

## 2013-05-03 ENCOUNTER — Other Ambulatory Visit: Payer: BC Managed Care – PPO

## 2013-05-05 ENCOUNTER — Ambulatory Visit (INDEPENDENT_AMBULATORY_CARE_PROVIDER_SITE_OTHER): Payer: BC Managed Care – PPO

## 2013-05-05 ENCOUNTER — Encounter: Payer: Self-pay | Admitting: Obstetrics and Gynecology

## 2013-05-05 ENCOUNTER — Ambulatory Visit (INDEPENDENT_AMBULATORY_CARE_PROVIDER_SITE_OTHER): Payer: BC Managed Care – PPO | Admitting: Obstetrics and Gynecology

## 2013-05-05 VITALS — BP 140/74 | HR 60 | Ht <= 58 in

## 2013-05-05 DIAGNOSIS — N950 Postmenopausal bleeding: Secondary | ICD-10-CM

## 2013-05-05 NOTE — Patient Instructions (Signed)
Endometrial Biopsy This is a test in which a tissue sample (a biopsy) is taken from inside the uterus (womb). It is then looked at by a specialist under a microscope to see if the tissue is normal or abnormal. The endometrium is the lining of the uterus. This test helps determine where you are in your menstrual cycle and how hormone levels are affecting the lining of the uterus. Another use for this test is to diagnose endometrial cancer, tuberculosis, polyps, or inflammatory conditions and to evaluate uterine bleeding. PREPARATION FOR TEST No preparation or fasting is necessary. NORMAL FINDINGS No pathologic conditions. Presence of "secretory-type" endometrium 3 to 5 days before to normal menstruation. Ranges for normal findings may vary among different laboratories and hospitals. You should always check with your doctor after having lab work or other tests done to discuss the meaning of your test results and whether your values are considered within normal limits. MEANING OF TEST  Your caregiver will go over the test results with you and discuss the importance and meaning of your results, as well as treatment options and the need for additional tests if necessary. OBTAINING THE TEST RESULTS It is your responsibility to obtain your test results. Ask the lab or department performing the test when and how you will get your results. Document Released: 02/14/2005 Document Revised: 01/06/2012 Document Reviewed: 09/23/2008 [employer] Patient Information 2014 [employer].  

## 2013-05-05 NOTE — Progress Notes (Signed)
Subjective  Patient here for pelvic ultrasound and possible saline ultrasound/endometrial biopsy.  Had spotting with wiping and on pad 1 -2 weeks ago. LMP over one year ago.  History of breast cancer. Status post lumpectomy and XRT. Took tamoxifen for 5 years, last in 2011. Also took Evista for osteoporosis but stopped due to bone pain.   Patient seeing PCP this afternoon regarding elevated HgbA1C.  Objective  Saline ultrasound and endometrial biopsy procedures.  Written consent obtained for procedures. Open sided speculum placed.  Sterile prep of the cervix with Betadine. Tenaculum to anterior cervical lip. Cervix dilated with an os finder. Catheter placed with the assistance of a ring forceps.  Speculum removed. Saline injected without difficulty.  Some cramping of the patient during the procedure. See images below.  Speculum replaced.  Reprep of cervix.  Pipelle passed once.  Tissue to pathology.  No complications to either procedure.     Assessment  Postmenopausal bleeding episode. History of breast cancer. Tamoxifen patient. Elevated hemoglobin A1C.  Plan  Follow up endometrial biopsy. Follow up with primary care provider regarding hyperglycemia.

## 2013-05-15 ENCOUNTER — Encounter: Payer: Self-pay | Admitting: Obstetrics and Gynecology

## 2013-05-19 ENCOUNTER — Telehealth: Payer: Self-pay

## 2013-05-19 NOTE — Telephone Encounter (Signed)
Called patient LMOVM Cell# asking her to call me to make lab appointment(needs to come in for urine culture).

## 2013-05-19 NOTE — Telephone Encounter (Signed)
Message copied by Alphonsa Overall on Wed May 19, 2013  2:47 PM ------      Message from: Conley Simmonds      Created: Mon May 17, 2013  2:12 PM      Regarding: FW: Urine Culture       Marchelle Folks,            Please have the patient return to give a urine specimen in the office.            Conley Simmonds            ----- Message -----         From: Clide Dales         Sent: 05/17/2013   9:19 AM           To: Melony Overly, MD      Subject: Urine Culture                                            A urine culture was not ordered for this pt on 05-05-13.            Morrie Sheldon       ------

## 2013-05-21 ENCOUNTER — Encounter: Payer: Self-pay | Admitting: Obstetrics and Gynecology

## 2013-05-24 ENCOUNTER — Encounter: Payer: Self-pay | Admitting: Obstetrics and Gynecology

## 2013-05-26 ENCOUNTER — Other Ambulatory Visit (INDEPENDENT_AMBULATORY_CARE_PROVIDER_SITE_OTHER): Payer: BC Managed Care – PPO

## 2013-05-26 ENCOUNTER — Encounter: Payer: Self-pay | Admitting: Obstetrics and Gynecology

## 2013-05-26 DIAGNOSIS — N390 Urinary tract infection, site not specified: Secondary | ICD-10-CM

## 2013-05-26 NOTE — Telephone Encounter (Signed)
Patient came into office today and completed urine culture.

## 2013-05-29 LAB — CULTURE, URINE COMPREHENSIVE

## 2013-08-03 ENCOUNTER — Other Ambulatory Visit: Payer: Self-pay | Admitting: Internal Medicine

## 2013-08-03 DIAGNOSIS — M810 Age-related osteoporosis without current pathological fracture: Secondary | ICD-10-CM

## 2013-09-02 ENCOUNTER — Other Ambulatory Visit: Payer: Self-pay

## 2013-10-15 ENCOUNTER — Ambulatory Visit
Admission: RE | Admit: 2013-10-15 | Discharge: 2013-10-15 | Disposition: A | Discharge status: Home / Self Care | Payer: BC Managed Care – PPO | Source: Ambulatory Visit | Attending: Internal Medicine | Admitting: Internal Medicine

## 2013-10-15 ENCOUNTER — Ambulatory Visit
Admission: RE | Admit: 2013-10-15 | Discharge: 2013-10-15 | Disposition: A | Discharge status: Home / Self Care | Payer: BC Managed Care – PPO | Source: Ambulatory Visit

## 2013-10-15 DIAGNOSIS — Z1231 Encounter for screening mammogram for malignant neoplasm of breast: Secondary | ICD-10-CM

## 2013-10-15 DIAGNOSIS — M810 Age-related osteoporosis without current pathological fracture: Secondary | ICD-10-CM

## 2014-04-18 ENCOUNTER — Encounter: Payer: Self-pay | Admitting: Obstetrics and Gynecology

## 2014-04-18 ENCOUNTER — Ambulatory Visit (INDEPENDENT_AMBULATORY_CARE_PROVIDER_SITE_OTHER): Payer: BC Managed Care – PPO | Admitting: Obstetrics and Gynecology

## 2014-04-18 VITALS — BP 140/82 | HR 80 | Ht <= 58 in | Wt 123.6 lb

## 2014-04-18 DIAGNOSIS — N393 Stress incontinence (female) (male): Secondary | ICD-10-CM

## 2014-04-18 DIAGNOSIS — Z0000 Encounter for general adult medical examination without abnormal findings: Secondary | ICD-10-CM

## 2014-04-18 DIAGNOSIS — N3281 Overactive bladder: Secondary | ICD-10-CM

## 2014-04-18 DIAGNOSIS — N318 Other neuromuscular dysfunction of bladder: Secondary | ICD-10-CM

## 2014-04-18 DIAGNOSIS — Z01419 Encounter for gynecological examination (general) (routine) without abnormal findings: Secondary | ICD-10-CM

## 2014-04-18 LAB — POCT URINALYSIS DIPSTICK
Bilirubin, UA: NEGATIVE
Ketones, UA: NEGATIVE
Leukocytes, UA: NEGATIVE
Nitrite, UA: NEGATIVE
PROTEIN UA: NEGATIVE
RBC UA: NEGATIVE
UROBILINOGEN UA: NEGATIVE
pH, UA: 5

## 2014-04-18 NOTE — Progress Notes (Signed)
Patient ID: Andrea [name], female   DOB: 04-24-1956, 58 y.o.   MRN: [id number] GYNECOLOGY VISIT  PCP:   Berneta Sages, MD  Referring provider:   HPI: 58 y.o.   Married  Asian  female   [place] with Patient's last menstrual period was 06/28/2010.   here for   AEX.  Some urinary leakage with urgency and night time urination.  Tried over the counter Oxytrol and did not feel well.  Had chest heaviness.  Tried Detrol and it caused thirst and hot flashes.   HgbA1C 7.4. Having nocturia - up to 3 times a night.  One coffee in am.  No other really regular caffeine use.   Leaks with turning over in the bed and moving around.  Limits her exercise.  Considering surgery. Had urodynamic testing many years ago.   Hgb:   Urine:  1+glucose (recently dx'd with diabetes)  GYNECOLOGIC HISTORY: Patient's last menstrual period was 06/28/2010. Sexually active:  no Partner preference: female Contraception:   postmenopausal Menopausal hormone therapy: no DES exposure:  no  Blood transfusions:  no  Sexually transmitted diseases: no   GYN procedures and prior surgeries:  Right lumpectomy for breast cancer. Last mammogram:  09/2013 wnl:[facility]               Last pap and high risk HPV testing:   03/2012 wnl:neg HR HPV History of abnormal pap smear: 58 years ago after delivery of her child had abnormal pap--had colposcopy and cryotherapy to cervix.  Paps have been normal since.    OB History   Grav Para Term Preterm Abortions TAB SAB Ect Mult Living   1 1 1       1        LIFESTYLE: Exercise:    Walking 3-4 x per week           Tobacco:    no Alcohol:      no Drug use:  no  OTHER HEALTH MAINTENANCE: Tetanus/TDap:  Up to date with PCP Gardisil:             n/a Influenza:          never Zostavax:           n/a  Bone density:    09/2013 -- osteopenia and osteoporosis:[facility] Colonoscopy:     02/2010 with Dr. Chelsea Aus.  Next colonoscopy due 2021.  Cholesterol  check:   Family History  Problem Relation Age of Onset  . Heart disease Mother   . Diabetes Father   . Breast cancer Sister   . Hypertension Brother     There are no active problems to display for this patient.  Past Medical History  Diagnosis Date  . Cancer 2005    breast cancer, right  . Hypertension   . Osteoporosis   . Urinary incontinence     Past Surgical History  Procedure Laterality Date  . Breast surgery Right 2005    lumpectomy  . Cesarean section  1991    ALLERGIES: Review of patient's allergies indicates no known allergies.  Current Outpatient Prescriptions  Medication Sig Dispense Refill  . aspirin EC 81 MG tablet Take 81 mg by mouth daily.      . Cholecalciferol (VITAMIN D) 2000 UNITS CAPS Take 1 capsule by mouth daily.      . nitrofurantoin, macrocrystal-monohydrate, (MACROBID) 100 MG capsule Take 1 capsule (100 mg total) by mouth 2 (two) times daily.  10 capsule  0  .  TOPROL XL 100 MG 24 hr tablet Take 1 tablet by mouth daily.       No current facility-administered medications for this visit.     ROS:  Pertinent items are noted in HPI.  SOCIAL HISTORY:  Nurse - ambulatory care.   PHYSICAL EXAMINATION:    BP 140/82  Pulse 80  Ht 4\' 10"  (1.473 m)  Wt 123 lb 9.6 oz (56.065 kg)  BMI 25.84 kg/m2  LMP 06/28/2010   Wt Readings from Last 3 Encounters:  04/18/14 123 lb 9.6 oz (56.065 kg)  04/12/13 124 lb (56.246 kg)     Ht Readings from Last 3 Encounters:  04/18/14 4\' 10"  (1.473 m)  05/05/13 4\' 10"  (1.473 m)  04/12/13 4\' 10"  (1.473 m)    General appearance: alert, cooperative and appears stated age Head: Normocephalic, without obvious abnormality, atraumatic Neck: no adenopathy, supple, symmetrical, trachea midline and thyroid not enlarged, symmetric, no tenderness/mass/nodules Lungs: clear to auscultation bilaterally Breasts: Right breast scar, No nipple retraction or dimpling, No nipple discharge or bleeding, No axillary or supraclavicular  adenopathy, Normal to palpation without dominant masses Heart: regular rate and rhythm Abdomen: soft, non-tender; no masses,  no organomegaly Extremities: extremities normal, atraumatic, no cyanosis or edema Skin: Skin color, texture, turgor normal. No rashes or lesions Lymph nodes: Cervical, supraclavicular, and axillary nodes normal. No abnormal inguinal nodes palpated Neurologic: Grossly normal  Pelvic: External genitalia:  no lesions              Urethra:  normal appearing urethra with no masses, tenderness or lesions              Bartholins and Skenes: normal                 Vagina: normal appearing vagina with normal color and discharge, no lesions              Cervix: normal appearance              Pap and high risk HPV testing done: no.            Bimanual Exam:  Uterus:  uterus is normal size, shape, consistency and nontender                                      Adnexa: normal adnexa in size, nontender and no masses                                      Rectovaginal: Confirms                                      Anus:  normal sphincter tone, no lesions  ASSESSMENT  Normal gynecologic exam. Genuine stress incontinence. History of right breast cancer.  Osteoporosis.  Not currently on Rx but may start Evista in the future.  HTN.  On Toprol XL.   PLAN  Mammogram recommended yearly.  Pap smear and high risk HPV testing not indicated.  Counseled on self breast exam, Calcium and vitamin D intake, exercise. Discussion regarding urinary incontinence and overactive bladder.  ACOG handouts to patient on urinary incontinence and surgery for this.  Will return for urodynamic testing.  I have discussed a midurethral sling with the  patient today and in the past.  No Rx for overactive bladder at this time.  I encouraged good control of glucose.  She is desiring to proceed forward at this time.   Return annually or prn   An After Visit Summary was printed and given to the  patient.

## 2014-04-18 NOTE — Patient Instructions (Signed)

## 2014-04-19 ENCOUNTER — Telehealth: Payer: Self-pay | Admitting: Obstetrics and Gynecology

## 2014-04-19 NOTE — Telephone Encounter (Signed)
Left message for patient to call back. Need to discuss benefits and schedule urodynamics.

## 2014-04-22 ENCOUNTER — Other Ambulatory Visit: Payer: Self-pay

## 2014-04-22 DIAGNOSIS — Z1231 Encounter for screening mammogram for malignant neoplasm of breast: Secondary | ICD-10-CM

## 2014-04-22 DIAGNOSIS — Z9889 Other specified postprocedural states: Secondary | ICD-10-CM

## 2014-04-22 NOTE — Telephone Encounter (Signed)
Spoke with patient. Advised that per benefit quote received, her out of pocket expense for urodynamics testing prior to 07/31 will be $599.16. Plan renews 08.01.2015. Patient will not have met ded 08/05 urodyamics date. PR will then be allowed amount: $1198.32. Patient not ready to schedule procedure because she is not sure that she wants to proceed with surgery.  Patient has question regarding "how long the test is good for" because she may not want surgery or may not want surgery at all.

## 2014-04-25 NOTE — Telephone Encounter (Signed)
6 months is appropriate as long as the patient does not have any significant medical changes in the interim.

## 2014-04-25 NOTE — Telephone Encounter (Signed)
I believe that the urodynamic testing results would be good for 6 months unless patient's symptoms change.  If she is not interested in surgery, may be best to wait. Will send to Dr Quincy Simmonds for advise.

## 2014-04-26 NOTE — Telephone Encounter (Signed)
Cal to patient, LMTCB.

## 2014-06-08 ENCOUNTER — Ambulatory Visit (INDEPENDENT_AMBULATORY_CARE_PROVIDER_SITE_OTHER): Payer: BC Managed Care – PPO

## 2014-06-08 ENCOUNTER — Ambulatory Visit (INDEPENDENT_AMBULATORY_CARE_PROVIDER_SITE_OTHER): Payer: BC Managed Care – PPO | Admitting: Podiatry

## 2014-06-08 ENCOUNTER — Encounter: Payer: Self-pay | Admitting: Podiatry

## 2014-06-08 VITALS — BP 146/77 | HR 71 | Resp 15 | Ht <= 58 in | Wt 122.0 lb

## 2014-06-08 DIAGNOSIS — M722 Plantar fascial fibromatosis: Secondary | ICD-10-CM

## 2014-06-08 NOTE — Patient Instructions (Signed)
Plantar Fasciitis (Heel Spur Syndrome) with Rehab The plantar fascia is a fibrous, ligament-like, soft-tissue structure that spans the bottom of the foot. Plantar fasciitis is a condition that causes pain in the foot due to inflammation of the tissue. SYMPTOMS   Pain and tenderness on the underneath side of the foot.  Pain that worsens with standing or walking. CAUSES  Plantar fasciitis is caused by irritation and injury to the plantar fascia on the underneath side of the foot. Common mechanisms of injury include:  Direct trauma to bottom of the foot.  Damage to a small nerve that runs under the foot where the main fascia attaches to the heel bone.  Stress placed on the plantar fascia due to bone spurs. RISK INCREASES WITH:   Activities that place stress on the plantar fascia (running, jumping, pivoting, or cutting).  Poor strength and flexibility.  Improperly fitted shoes.  Tight calf muscles.  Flat feet.  Failure to warm-up properly before activity.  Obesity. PREVENTION  Warm up and stretch properly before activity.  Allow for adequate recovery between workouts.  Maintain physical fitness:  Strength, flexibility, and endurance.  Cardiovascular fitness.  Maintain a health body weight.  Avoid stress on the plantar fascia.  Wear properly fitted shoes, including arch supports for individuals who have flat feet. PROGNOSIS  If treated properly, then the symptoms of plantar fasciitis usually resolve without surgery. However, occasionally surgery is necessary. RELATED COMPLICATIONS   Recurrent symptoms that may result in a chronic condition.  Problems of the lower back that are caused by compensating for the injury, such as limping.  Pain or weakness of the foot during push-off following surgery.  Chronic inflammation, scarring, and partial or complete fascia tear, occurring more often from repeated injections. TREATMENT  Treatment initially involves the use of  ice and medication to help reduce pain and inflammation. The use of strengthening and stretching exercises may help reduce pain with activity, especially stretches of the Achilles tendon. These exercises may be performed at home or with a therapist. Your caregiver may recommend that you use heel cups of arch supports to help reduce stress on the plantar fascia. Occasionally, corticosteroid injections are given to reduce inflammation. If symptoms persist for greater than 6 months despite non-surgical (conservative), then surgery may be recommended.  MEDICATION   If pain medication is necessary, then nonsteroidal anti-inflammatory medications, such as aspirin and ibuprofen, or other minor pain relievers, such as acetaminophen, are often recommended.  Do not take pain medication within 7 days before surgery.  Prescription pain relievers may be given if deemed necessary by your caregiver. Use only as directed and only as much as you need.  Corticosteroid injections may be given by your caregiver. These injections should be reserved for the most serious cases, because they may only be given a certain number of times. HEAT AND COLD  Cold treatment (icing) relieves pain and reduces inflammation. Cold treatment should be applied for 10 to 15 minutes every 2 to 3 hours for inflammation and pain and immediately after any activity that aggravates your symptoms. Use ice packs or massage the area with a piece of ice (ice massage).  Heat treatment may be used prior to performing the stretching and strengthening activities prescribed by your caregiver, physical therapist, or athletic trainer. Use a heat pack or soak the injury in warm water. SEEK IMMEDIATE MEDICAL CARE IF:  Treatment seems to offer no benefit, or the condition worsens.  Any medications produce adverse side effects. EXERCISES RANGE   OF MOTION (ROM) AND STRETCHING EXERCISES - Plantar Fasciitis (Heel Spur Syndrome) These exercises may help you  when beginning to rehabilitate your injury. Your symptoms may resolve with or without further involvement from your physician, physical therapist or athletic trainer. While completing these exercises, remember:   Restoring tissue flexibility helps normal motion to return to the joints. This allows healthier, less painful movement and activity.  An effective stretch should be held for at least 30 seconds.  A stretch should never be painful. You should only feel a gentle lengthening or release in the stretched tissue. RANGE OF MOTION - Toe Extension, Flexion  Sit with your right / left leg crossed over your opposite knee.  Grasp your toes and gently pull them back toward the top of your foot. You should feel a stretch on the bottom of your toes and/or foot.  Hold this stretch for __________ seconds.  Now, gently pull your toes toward the bottom of your foot. You should feel a stretch on the top of your toes and or foot.  Hold this stretch for __________ seconds. Repeat __________ times. Complete this stretch __________ times per day.  RANGE OF MOTION - Ankle Dorsiflexion, Active Assisted  Remove shoes and sit on a chair that is preferably not on a carpeted surface.  Place right / left foot under knee. Extend your opposite leg for support.  Keeping your heel down, slide your right / left foot back toward the chair until you feel a stretch at your ankle or calf. If you do not feel a stretch, slide your bottom forward to the edge of the chair, while still keeping your heel down.  Hold this stretch for __________ seconds. Repeat __________ times. Complete this stretch __________ times per day.  STRETCH - Gastroc, Standing  Place hands on wall.  Extend right / left leg, keeping the front knee somewhat bent.  Slightly point your toes inward on your back foot.  Keeping your right / left heel on the floor and your knee straight, shift your weight toward the wall, not allowing your back to  arch.  You should feel a gentle stretch in the right / left calf. Hold this position for __________ seconds. Repeat __________ times. Complete this stretch __________ times per day. STRETCH - Soleus, Standing  Place hands on wall.  Extend right / left leg, keeping the other knee somewhat bent.  Slightly point your toes inward on your back foot.  Keep your right / left heel on the floor, bend your back knee, and slightly shift your weight over the back leg so that you feel a gentle stretch deep in your back calf.  Hold this position for __________ seconds. Repeat __________ times. Complete this stretch __________ times per day. STRETCH - Gastrocsoleus, Standing  Note: This exercise can place a lot of stress on your foot and ankle. Please complete this exercise only if specifically instructed by your caregiver.   Place the ball of your right / left foot on a step, keeping your other foot firmly on the same step.  Hold on to the wall or a rail for balance.  Slowly lift your other foot, allowing your body weight to press your heel down over the edge of the step.  You should feel a stretch in your right / left calf.  Hold this position for __________ seconds.  Repeat this exercise with a slight bend in your right / left knee. Repeat __________ times. Complete this stretch __________ times per day.    STRENGTHENING EXERCISES - Plantar Fasciitis (Heel Spur Syndrome)  These exercises may help you when beginning to rehabilitate your injury. They may resolve your symptoms with or without further involvement from your physician, physical therapist or athletic trainer. While completing these exercises, remember:   Muscles can gain both the endurance and the strength needed for everyday activities through controlled exercises.  Complete these exercises as instructed by your physician, physical therapist or athletic trainer. Progress the resistance and repetitions only as guided. STRENGTH -  Towel Curls  Sit in a chair positioned on a non-carpeted surface.  Place your foot on a towel, keeping your heel on the floor.  Pull the towel toward your heel by only curling your toes. Keep your heel on the floor.  If instructed by your physician, physical therapist or athletic trainer, add ____________________ at the end of the towel. Repeat __________ times. Complete this exercise __________ times per day. STRENGTH - Ankle Inversion  Secure one end of a rubber exercise band/tubing to a fixed object (table, pole). Loop the other end around your foot just before your toes.  Place your fists between your knees. This will focus your strengthening at your ankle.  Slowly, pull your big toe up and in, making sure the band/tubing is positioned to resist the entire motion.  Hold this position for __________ seconds.  Have your muscles resist the band/tubing as it slowly pulls your foot back to the starting position. Repeat __________ times. Complete this exercises __________ times per day.  Document Released: 10/14/2005 Document Revised: 01/06/2012 Document Reviewed: 01/26/2009 [employer] Patient Information 2015 [employer]. This information is not intended to replace advice given to you by your health care provider. Make sure you discuss any questions you have with your health care provider.  

## 2014-06-08 NOTE — Progress Notes (Signed)
   Subjective:    Patient ID: Andrea [name], female    DOB: May 24, 1956, 58 y.o.   MRN: [id number]  HPI Comments: Patient presents today with complaints of right heel pain which is impressive for possibly 6 months. It's that the pain is worse after periods of rest and decreased somewhat with activity. She exercises regularly (walking). She has had no prior treatment. Denies any trauma to the area      Review of Systems  Musculoskeletal:       R heel pain   All other systems reviewed and are negative.      Objective:   Physical Exam  Nursing note and vitals reviewed. Constitutional: She is oriented to person, place, and time. She appears well-developed and well-nourished.  Musculoskeletal: Normal range of motion.  Tenderness to palpation over the plantar medial tubercle of the calcaneus at the insertion of the plantar fascia. Plantar fascia intact.  Neurological: She is alert and oriented to person, place, and time.  Protective sensation intact.  Skin: No erythema.  No open lesions.          Assessment & Plan:  58 year old female with a right plantar fasciitis. -X-rays were obtained. See x-ray report. No acute fracture. -Conservative versus surgical treatment discussed with the patient in detail including alternatives, risks, complications. -At this time would recommend a steroid injection however due to the patient's osteoporosis we'll hold off on this. -Dispense plantar fascial brace -Discussed stretching exercises. -Ice the area. -Importance of proper shoe gear discussed the patient -Followup in 3 weeks or sooner if any problems are to arise. If in the next visit symptoms continue or not resolved will consider possible night splint, orthotics, immobilization, etc.

## 2014-06-24 ENCOUNTER — Encounter: Payer: Self-pay | Admitting: Genetic Counselor

## 2014-06-24 ENCOUNTER — Other Ambulatory Visit: Payer: BC Managed Care – PPO

## 2014-06-24 ENCOUNTER — Ambulatory Visit (HOSPITAL_BASED_OUTPATIENT_CLINIC_OR_DEPARTMENT_OTHER): Payer: BC Managed Care – PPO | Admitting: Genetic Counselor

## 2014-06-24 DIAGNOSIS — Z803 Family history of malignant neoplasm of breast: Secondary | ICD-10-CM

## 2014-06-24 DIAGNOSIS — Z853 Personal history of malignant neoplasm of breast: Secondary | ICD-10-CM | POA: Insufficient documentation

## 2014-06-24 DIAGNOSIS — IMO0002 Reserved for concepts with insufficient information to code with codable children: Secondary | ICD-10-CM

## 2014-06-24 DIAGNOSIS — C50919 Malignant neoplasm of unspecified site of unspecified female breast: Secondary | ICD-10-CM | POA: Insufficient documentation

## 2014-06-24 NOTE — Progress Notes (Signed)
HISTORY OF PRESENT ILLNESS: Dr. Dagmar Hait requested a cancer genetics consultation for Andrea [name], a 58 y.o. female, due to a personal and family history of breast cancer.  Andrea [name] presents to clinic today to discuss the possibility of a hereditary predisposition to cancer, genetic testing, and to further clarify her future cancer risks, as well as potential cancer risk for family members. Andrea [name] was diagnosed with breast cancer at the age of 44.   Past Medical History  Diagnosis Date   Cancer 2005    breast cancer, right   Hypertension    Urinary incontinence    Osteoporosis     and osteopenia   Diabetes mellitus without complication    Hypercholesteremia     Past Surgical History  Procedure Laterality Date   Breast surgery Right 2005    lumpectomy   Cesarean section  1991    History   Social History   Marital Status: Married    Spouse Name: N/A    Number of Children: N/A   Years of Education: N/A   Social History Main Topics   Smoking status: Never Smoker    Smokeless tobacco: Never Used   Alcohol Use: No   Drug Use: No   Sexual Activity: No   Other Topics Concern   Not on file   Social History Narrative   No narrative on file     FAMILY HISTORY:  During the visit, a 4-generation pedigree was obtained. Significant diagnoses include the following:  Family History  Problem Relation Age of Onset   Heart disease Mother    Diabetes Father    Breast cancer Sister 60    bilateral breast cancer   Hypertension Brother     Andrea [name] ancestry is of [place] descent. There is no known Jewish ancestry or consanguinity.  GENETIC COUNSELING ASSESSMENT: Andrea [name] is a 58 y.o. female with a personal and family history of cancer suggestive of a hereditary predisposition to cancer. We, therefore, discussed and recommended the following at today's visit.   DISCUSSION: We reviewed the characteristics, features and inheritance  patterns of hereditary cancer syndromes. We also discussed genetic testing, including the appropriate family members to test, the process of testing, insurance coverage and turn-around-time for results. We discussed the implications of a negative, positive and/or variant of uncertain significant result. We recommended Andrea [name] pursue genetic testing for the [facility] gene panel.   PLAN: Based on our above recommendation, Andrea [name] wished to pursue genetic testing and the blood sample was drawn and will be sent to [facility] for analysis. Results should be available within approximately 5 weeks time, at which point they will be disclosed by telephone to Andrea [name], as will any additional recommendations warranted by these results. We also encouraged Andrea [name] to remain in contact with cancer genetics annually so that we can continuously update the family history and inform her of any changes in cancer genetics and testing that may be of benefit for this family. Ms.  [name]'s questions were answered to her satisfaction today. Our contact information was provided should additional questions or concerns arise.   Thank you for the referral and allowing [place] to share in the care of your patient.   The patient was seen for a total of 40 minutes in face-to-face genetic counseling.  This patient was discussed with Magrinat who agrees with the above.    _______________________________________________________________________ For Office Staff:  Number of people involved in session: 2 Was an Intern/ student  involved with case: not applicable

## 2014-07-26 ENCOUNTER — Encounter: Payer: Self-pay | Admitting: Genetic Counselor

## 2014-07-26 DIAGNOSIS — Z853 Personal history of malignant neoplasm of breast: Secondary | ICD-10-CM

## 2014-07-26 DIAGNOSIS — C50919 Malignant neoplasm of unspecified site of unspecified female breast: Secondary | ICD-10-CM

## 2014-07-26 NOTE — Progress Notes (Signed)
HPI:  Andrea [name] was previously seen in the [place] clinic due to a personal and family history of cancer and concerns regarding a hereditary predisposition to cancer. Please refer to our prior cancer genetics clinic note for more information regarding Andrea [name]'s medical, social and family histories, and our assessment and recommendations, at the time. Andrea [name] recent genetic test results were disclosed to her, as were recommendations warranted by these results. These results and recommendations are discussed in more detail below.  GENETIC TEST RESULTS: At the time of Andrea [name]'s visit, we recommended she pursue genetic testing of the [facility]Next gene panel. This test, which included sequencing and deletion/duplication analysis of the genes, was performed at [facility]. Genetic testing was normal, and did not reveal a deleterious mutation in these genes. A complete list of all genes tested is located on the test report scanned into [employer].    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 At this time, it is unknown if this variant is associated with an increased risk for cancer or if this is a normal finding. With time, we suspect the lab will reclassify this variant and when they do, we will try to re-contact Andrea [name] to discuss the reclassification further.   We discussed with Andrea [name] that since the current genetic testing is not perfect, it is possible there may be a gene mutation in one of these genes that current testing cannot detect, but that chance is small.  We also discussed, that it is possible that another gene that has not yet been discovered, or that we have not yet tested, is responsible for the cancer diagnoses in the family, and it is, therefore, important to remain in touch with cancer genetics in the future so that we can continue to offer Andrea [name] the most up to date genetic  testing.   CANCER SCREENING RECOMMENDATIONS: This result is generally reassuring and suggests that Andrea [name]'s cancer was most likely not due to an inherited predisposition associated with one of these genes.  Most cancers happen by chance and this negative test suggests that her cancer falls into this category.  We, therefore, recommended she continue to follow the cancer management and screening guidelines provided by her oncology and primary providers.   RECOMMENDATIONS FOR FAMILY MEMBERS:  Women in this family might be at some increased risk of developing cancer, over the general population risk, simply due to the family history of cancer.  We recommended women in this family have a yearly mammogram beginning at age 70, an an annual clinical breast exam, and perform monthly breast self-exams. Women in this family should also have a gynecological exam as recommended by their primary provider. All family members should have a colonoscopy by age 68.  FOLLOW-UP: Lastly, we discussed with Andrea [name] that cancer genetics is a rapidly advancing field and it is possible that new genetic tests will be appropriate for her and/or her family members in the future. We encouraged her to remain in contact with cancer genetics on an annual basis so we can update her personal and family histories and let her know of advances in cancer genetics that may benefit this family.   Our contact number was provided. Andrea [name]'s questions were answered to her satisfaction, and she knows she is welcome to call us at anytime with additional questions or concerns.   Catherine A. Fine, MS, CGC Certified Genetic Counseor [email or web address]

## 2014-08-29 ENCOUNTER — Encounter: Payer: Self-pay | Admitting: Genetic Counselor

## 2014-10-17 ENCOUNTER — Ambulatory Visit
Admission: RE | Admit: 2014-10-17 | Discharge: 2014-10-17 | Disposition: A | Discharge status: Home / Self Care | Payer: BC Managed Care – PPO | Source: Ambulatory Visit

## 2014-10-17 DIAGNOSIS — Z1231 Encounter for screening mammogram for malignant neoplasm of breast: Secondary | ICD-10-CM

## 2014-10-17 DIAGNOSIS — Z9889 Other specified postprocedural states: Secondary | ICD-10-CM

## 2014-11-07 ENCOUNTER — Encounter: Payer: Self-pay | Admitting: Obstetrics and Gynecology

## 2014-11-08 NOTE — Telephone Encounter (Signed)
Updated patient health maintenance records.  ,Routing to provider for final review. Patient agreeable to disposition. Will close encounter

## 2014-11-10 ENCOUNTER — Encounter: Payer: Self-pay | Admitting: Obstetrics and Gynecology

## 2014-11-25 ENCOUNTER — Telehealth: Payer: Self-pay | Admitting: Obstetrics and Gynecology

## 2014-11-25 NOTE — Telephone Encounter (Signed)
Left message for patient to call back. Need to go over urodynamics and possible surgical benefits

## 2014-11-30 NOTE — Telephone Encounter (Signed)
Left message for patient to call back. Need to go over urodynamics and possible surgical benefits

## 2014-12-13 NOTE — Telephone Encounter (Signed)
Reviewed with Dr Quincy Simmonds. Patient initially requested this information through MyChart message. Since patient has not replied, can wait for future contact to be patient generated.  Routing to provider for final review.  Will close encounter.

## 2014-12-13 NOTE — Telephone Encounter (Signed)
I am in agreement with this plan as we discussed.  We will wait for the patient to contact our office if she would like to proceed with further evaluation and treatment of urinary incontinence.

## 2014-12-17 ENCOUNTER — Encounter: Payer: Self-pay | Admitting: Obstetrics and Gynecology

## 2014-12-19 NOTE — Telephone Encounter (Signed)
Routing to provider for final review. Patient agreeable to disposition. Will close encounter.     

## 2015-01-05 ENCOUNTER — Telehealth: Payer: Self-pay | Admitting: Obstetrics and Gynecology

## 2015-01-05 NOTE — Telephone Encounter (Signed)
Thank you for the update.   [place]

## 2015-01-05 NOTE — Telephone Encounter (Signed)
Patient called. I went over benefit quote received for urodynamics and possible surgery. Patient agreeable and wants to proceed with scheduling.  I will call patient tomorrow at 1:15 with dates available for urodynamics testing in the month of April and will hopefully proceed with scheduling.

## 2015-01-06 NOTE — Telephone Encounter (Signed)
Call to patient. Scheduled urodynamics and associated urinalysis.

## 2015-02-17 ENCOUNTER — Ambulatory Visit (INDEPENDENT_AMBULATORY_CARE_PROVIDER_SITE_OTHER): Payer: BLUE CROSS/BLUE SHIELD | Admitting: *Deleted

## 2015-02-17 ENCOUNTER — Encounter: Payer: Self-pay | Admitting: Obstetrics and Gynecology

## 2015-02-17 VITALS — BP 110/76 | HR 78 | Ht <= 58 in | Wt 120.2 lb

## 2015-02-17 DIAGNOSIS — N393 Stress incontinence (female) (male): Secondary | ICD-10-CM

## 2015-02-17 LAB — POCT URINALYSIS DIPSTICK
Leukocytes, UA: NEGATIVE
Urobilinogen, UA: NEGATIVE
pH, UA: 5

## 2015-02-17 NOTE — Patient Instructions (Signed)
Urodynamic Testing If you have a problem with urine leakage, urodynamic testing may be useful. This non-invasive test will be helpful to find out the cause of the problem. Problems in the urinary system can be caused by aging, illness, or injury. The muscles in your ureters, bladder, and urethra can become weaker with age. You may have more urinary infections and bladder stones because of the weakened bladder muscles. The muscles may not empty your bladder completely. Also, weakening of the urethral sphincter and the muscles of the pelvic floor can cause stress urinary continence. This is because the sphincter cannot remain tight enough to hold urine in the bladder or does not have enough support from the pelvic muscles. Urodynamics is the study of how the body stores and releases urine. These tests help your caregiver see how well your bladder and sphincter muscles work. The tests can help explain symptoms such as:  Inability to control your urine (incontinence).  Sudden, strong urges to urinate.  Painful urination.  Recurrent urinary tract infections.  Frequent urination.  Problems starting a urine stream.  Problems emptying your bladder completely. PREPARATION FOR TEST If your caregiver recommends bladder testing, usually no special preparations are needed. Make sure you understand any instructions you receive. Depending on the test, you may be asked to come with a full bladder or an empty one. Also, ask whether you should change your diet or skip your regular medicines and for how long. TAKING THE TEST Any procedure designed to provide information about a bladder problem can be called a urodynamic test. Your caregiver will want to know whether:  You have difficulty starting a urine stream.  How hard you have to strain to maintain it.  The stream is interrupted.  Any urine is left in your bladder when you are done (post void residual). Urodynamic tests can range from simple  observation to precise measurement using instruments. TESTING METHODS The type of test you take depends on your problem. The different tests are described below.  The use of imaging equipment. This equipment films urination.  Urinating behind a curtain while a doctor or nurse listens.  If leakage is the problem, a pad test is a simple way to measure how much urine seeps out. You will be given a number of absorbent pads and plastic bags. You will be told to wear the pad for 1 or 2 hours and then seal it in a bag. Your caregiver will then weigh the bags to see how much urine has been caught in the pad. A simpler, but less precise method is to change pads as often as you need to and keep track of how many pads you use in a day.  A physical exam will also be performed to rule out other causes of urinary problems. Other causes could include weakening pelvic muscles (pelvic prolapse) or prostate enlargement.  Pressure Flow Study-you will empty your bladder so a catheter can measure the pressures required to urinate. This study helps to identify causes of bladder outlet obstruction that men may experience with prostate enlargement. Bladder outlet obstruction is less common in women but can occur with a fallen bladder or rarely after a surgical procedure for urinary incontinence.  Electromyography (Measurement of Nerve Impulses)-If your caregiver thinks that your urinary problem is related to nerve damage, you may be given an electromyography. This test measures the muscle activity in the urethral sphincter. It uses sensors placed on the skin near the urethra and rectum. Muscle activity is recorded  on a machine. The impulse patterns show if the messages sent to the bladder and urethra are coordinated correctly.  Video Urodynamics-can be performed with or without equipment to take pictures of the bladder during filling and emptying. The imaging equipment may use X-rays or sound waves. If X-ray equipment is  used, the liquid used to fill the bladder may be a contrast medium that will show up on the X-ray. The pictures and videos show the size and shape of the urinary tract and help your caregiver understand your problem. AFTER THE TEST You may have mild discomfort for a few hours after these tests. Drinking two, 8-ounce glasses of water, each hour for 2 hours should help. Ask your caregiver if you can take a warm bath. If not, you may be able to hold a warm, damp washcloth over the urethral opening. This may relieve discomfort. You may be given an antibiotic to prevent an infection. Call your caregiver if you have signs of infection. These signs include pain, chills, or fever. OBTAINING THE TEST RESULTS Results for simple tests can be discussed with your caregiver immediately after the test. Results of other tests may take a few days. You will have time to ask questions about the results and possible treatments for your problem. It is your responsibility to obtain your test results. Ask the lab or department performing the test when and how you will get your results. FOR MORE INFORMATION  American Urological Association: www.auanet.[place] for Urologic Disease: www.afud.org Interstitial Cystitis Association of America: [place].it National Kidney and Urologic Diseases Information Clearinghouse: nkudic@info .AmenCredit.is Document Released: 08/11/2007 Document Revised: 01/06/2012 Document Reviewed: 01/24/2014 ExitCare Patient Information 2015 [place], [place]. This information is not intended to replace advice given to you by your health care provider. Make sure you discuss any questions you have with your health care provider.

## 2015-02-17 NOTE — Progress Notes (Signed)
Patient is here for urinalysis for urodynamics Urinalysis Result: Normal Urodynamics Instructions Given to patient. Urodynamics scheduled for 02/22/15 with Dr. Quincy Simmonds, patient is aware. Routed to provider for review, encounter closed.

## 2015-02-19 NOTE — Progress Notes (Signed)
Encounter reviewed by Dr. Brook Silva.  

## 2015-02-20 ENCOUNTER — Telehealth: Payer: Self-pay | Admitting: Emergency Medicine

## 2015-02-20 NOTE — Telephone Encounter (Signed)
Message left to return call to [facility] at [phone] to discuss instructions prior to urodynamics that is scheduled 02/22/15 at 0900

## 2015-02-21 NOTE — Telephone Encounter (Signed)
Called patient and detailed message left. Advised appointment is for tomorrow, Wednesday at 0900 for urodynamics. Arrive with full bladder.

## 2015-02-21 NOTE — Telephone Encounter (Signed)
Pt left voicemail returning call. Ok to leave detailed message at [phone].

## 2015-02-21 NOTE — Telephone Encounter (Signed)
Pt left voicemail with questions regarding instructions. Pt states the message left for her stated appointment Thursday and her appt is tomorrow at [place]. Pt would like confirmation and any changes in instruction.

## 2015-02-21 NOTE — Telephone Encounter (Signed)
Detailed message left to advise I was calling to discuss instructions and procedure for Urodynamics. Advised patient to please come for appointment with full bladder. Advised to call back with any questions.

## 2015-02-22 ENCOUNTER — Ambulatory Visit (INDEPENDENT_AMBULATORY_CARE_PROVIDER_SITE_OTHER): Payer: BLUE CROSS/BLUE SHIELD | Admitting: Obstetrics and Gynecology

## 2015-02-22 ENCOUNTER — Encounter: Payer: Self-pay | Admitting: Obstetrics and Gynecology

## 2015-02-22 DIAGNOSIS — N393 Stress incontinence (female) (male): Secondary | ICD-10-CM | POA: Diagnosis not present

## 2015-02-22 NOTE — Progress Notes (Signed)
Andrea [name] is a 59 y.o. female Who presents today for urodynamics testing, ordered by Dr. Quincy Simmonds.   Allergies and medications reviewed.  Denies complaints today. No urinary complaints.   Urine Micro exam: negative for WBC's or RBC's, okay to proceed per Dr. Quincy Simmonds.  Patient reports urinary leakage with coughing, sneezing, exercise.   Urodynamics testing initiated. Lumax Bladder Catheter #10 [place] and lumax Abdominal Catheter #10 [place].   Post void residual 10 ml.   Urethral catheter placed without issue. Rectal catheter placed without issue.   Urodynamics testing completed. Please see scanned Patient summary report in Epic. Procedure completed and patient tolerated well without complaints. Patient scheduled for follow up office visit with Dr. Quincy Simmonds to discuss results. Patient agreeable.   Patient given post procedure instructions:  You may have a mild bladder and rectal discomfort for a few hours after the test. You may experience some frequent urination and slight burning the first few times you urinate after the test. Rarely, the urine may be blood tinged. These are both due to catheter placements and resolve quickly. You should call our office immediately if you have signs of infection, which may include bladder pain, urinary urgency, fever, or burning during urination. We do encourage you to drink plenty of water after the test.    Charges: [place]

## 2015-02-22 NOTE — Patient Instructions (Signed)
.   You may have a mild bladder and rectal discomfort for a few hours after the test. . You may experience some frequent urination and slight burning the first few times you urinate after the test. Rarely, the urine may be blood tinged. These are both due to catheter placements and resolve quickly.  . You should call our office immediately if you have signs of infection, which may include bladder pain, urinary urgency, fever, or burning during urination. .  We do encourage you to drink plenty of water after completion of the test today.   Please call our office if you have any questions.

## 2015-02-27 ENCOUNTER — Ambulatory Visit (INDEPENDENT_AMBULATORY_CARE_PROVIDER_SITE_OTHER): Payer: BLUE CROSS/BLUE SHIELD | Admitting: Obstetrics and Gynecology

## 2015-02-27 ENCOUNTER — Encounter: Payer: Self-pay | Admitting: Obstetrics and Gynecology

## 2015-02-27 VITALS — BP 122/68 | HR 64 | Ht <= 58 in | Wt 117.2 lb

## 2015-02-27 DIAGNOSIS — N3946 Mixed incontinence: Secondary | ICD-10-CM

## 2015-02-27 NOTE — Progress Notes (Signed)
Patient ID: Andrea [name], female   DOB: 05-31-1956, 59 y.o.   MRN: [id number] GYNECOLOGY  VISIT   HPI: 59 y.o.   Married  Asian  female   Vici with Patient's last menstrual period was 06/28/2010.   here to review results of urodynamics testing.    Test results from 02/22/15 - multichannel urodynamics Uroflow - voided 56 cc.  PVR 10 cc.  Not continuous.  CMG - S1 141 cc, S2 213 cc.  LPP 58 cm at 164 cmH20.  Unstable bladder.  Contraction at S2 and unable to control leakage. UPP - 19 cm H20.  Pressure flow - P det max 27 cm H20.  Mixed incontinence picture.   Leaking urine for many years.  Leaks with cough, laugh, sneeze, lifting, squatting, moving something heavy.  No leakage for no reason.  Tried Detrol and Oxytrol and did not tolerate both.  Interested in surgery for urinary incontinence.   Hx C/S.  Patient has diabetes. PCP is trying to discuss with patient injection therapy.  Stopped oral hypoglycemics.  Not on therapy. HgbA1C is still high.  Was 7.7 last time. Will be seeing her PCP July 19 for follow up.    GYNECOLOGIC HISTORY: Patient's last menstrual period was 06/28/2010. Contraception:  Postmenopausal Menopausal hormone therapy: none Last pap 6/20913 wnl:neg HR HPV (Hx colposcopy/Cryotherapy to cervix 1991) Last mammogram:  10-17-14 S/P Rt. Lumpectomy for breast cancer; heterogeneously dense/nl:[facility]        OB History    Gravida Para Term Preterm AB TAB SAB Ectopic Multiple Living   1 1 1       1          Patient Active Problem List   Diagnosis Date Noted  . History of breast cancer 06/24/2014  . Malignant neoplasm of breast (female), unspecified site 06/24/2014  . Genuine stress incontinence, female 04/18/2014  . Overactive bladder 04/18/2014    Past Medical History  Diagnosis Date  . Cancer 2005    breast cancer, right  . Hypertension   . Urinary incontinence   . Osteoporosis     and osteopenia  . Diabetes mellitus without  complication   . Hypercholesteremia     Past Surgical History  Procedure Laterality Date  . Breast surgery Right 2005    lumpectomy  . Cesarean section  1991    Current Outpatient Prescriptions  Medication Sig Dispense Refill  . Alogliptin Benzoate 25 MG TABS Take 1 tablet by mouth daily.    . Calcium Carbonate-Vit D-Min (CALTRATE 600+D PLUS MINIS PO) Take by mouth.    . Cholecalciferol (VITAMIN D) 2000 UNITS CAPS Take 1 capsule by mouth daily.    . Coenzyme Q10 (COQ10) 100 MG CAPS Take 100 mg by mouth daily.    . pravastatin (PRAVACHOL) 20 MG tablet Take 20 mg by mouth daily.    . ranitidine (ZANTAC) 150 MG tablet Take 150 mg by mouth daily.    . TOPROL XL 100 MG 24 hr tablet Take 1 tablet by mouth daily.     No current facility-administered medications for this visit.     ALLERGIES: Review of patient's allergies indicates no known allergies.  Family History  Problem Relation Age of Onset  . Heart disease Mother   . Diabetes Father   . Breast cancer Sister 41    bilateral breast cancer  . Hypertension Brother     History   Social History  . Marital Status: Married    Spouse Name: N/A  .  Number of Children: N/A  . Years of Education: N/A   Occupational History  . Not on file.   Social History Main Topics  . Smoking status: Never Smoker   . Smokeless tobacco: Never Used  . Alcohol Use: No  . Drug Use: No  . Sexual Activity: No   Other Topics Concern  . Not on file   Social History Narrative    ROS:  Pertinent items are noted in HPI.  PHYSICAL EXAMINATION:    BP 122/68 mmHg  Pulse 64  Ht 4\' 10"  (1.473 m)  Wt 117 lb 3.2 oz (53.162 kg)  BMI 24.50 kg/m2  LMP 06/28/2010     General appearance: alert, cooperative and appears stated age   ASSESSMENT  Mixed incontinence.  Diabetes mellitus, not controlled.   PLAN  Discussion of urodynamic testing.  Discussion of mixed incontinence.  Focused discussion of genuine stress incontinence and treatment  with midurethral sling.  Discussed permanent nature of the sling.  I discussed specific risks of the sling including mesh erosion and exposure, pelvic pain, painful intercourse, cystotomy, urinary retention and need for prolonged catheterization and self catheterization, slower voiding, urinary tract infections, surgical site infection, and increase in urgency and frequency.   Return for annual exam in July 2016 and re-examination then.  Follow up with PCP in July 2016 for diabetes control.    An After Visit Summary was printed and given to the patient.  ___25___ minutes face to face time of which over 50% was spent in counseling.

## 2015-02-27 NOTE — Progress Notes (Signed)
Encounter reviewed by Dr. Josefa Half.  Test results from 02/22/15 - multichannel urodynamics Uroflow - voided 56 cc.  PVR 10 cc.  Not continuous.  CMG - S1 141 cc, S2 213 cc.  LPP 58 cm at 164 cmH20.  Unstable bladder.  Contraction at S2 and unable to control leakage. UPP - 19 cm H20.  Pressure flow - P det max 27 cm H20.  Mixed incontinence picture.

## 2015-03-14 ENCOUNTER — Telehealth: Payer: Self-pay | Admitting: Obstetrics and Gynecology

## 2015-03-14 NOTE — Telephone Encounter (Signed)
Left patient a message to call back and ask for Starla.

## 2015-04-26 ENCOUNTER — Encounter: Payer: Self-pay | Admitting: Internal Medicine

## 2015-04-28 ENCOUNTER — Encounter: Payer: Self-pay | Admitting: Obstetrics and Gynecology

## 2015-04-28 ENCOUNTER — Ambulatory Visit (INDEPENDENT_AMBULATORY_CARE_PROVIDER_SITE_OTHER): Payer: BLUE CROSS/BLUE SHIELD | Admitting: Obstetrics and Gynecology

## 2015-04-28 VITALS — BP 112/62 | HR 80 | Resp 16 | Ht <= 58 in | Wt 117.0 lb

## 2015-04-28 DIAGNOSIS — N3946 Mixed incontinence: Secondary | ICD-10-CM | POA: Diagnosis not present

## 2015-04-28 DIAGNOSIS — Z0000 Encounter for general adult medical examination without abnormal findings: Secondary | ICD-10-CM | POA: Diagnosis not present

## 2015-04-28 DIAGNOSIS — Z01419 Encounter for gynecological examination (general) (routine) without abnormal findings: Secondary | ICD-10-CM | POA: Diagnosis not present

## 2015-04-28 DIAGNOSIS — M810 Age-related osteoporosis without current pathological fracture: Secondary | ICD-10-CM

## 2015-04-28 LAB — POCT URINALYSIS DIPSTICK
Bilirubin, UA: NEGATIVE
Blood, UA: NEGATIVE
Glucose, UA: NEGATIVE
Ketones, UA: NEGATIVE
Leukocytes, UA: NEGATIVE
Nitrite, UA: NEGATIVE
PROTEIN UA: NEGATIVE
UROBILINOGEN UA: NEGATIVE
pH, UA: 5

## 2015-04-28 NOTE — Patient Instructions (Signed)

## 2015-04-28 NOTE — Progress Notes (Signed)
59 y.o. G43P1001 Married [place] female here for annual exam.    No postmenopausal bleeding.  Hx of PMB and had normal pelvic ultrasound and sonohysterogram.  Then EMS. EMB - benign polypoid inactive endometrium.  Urinary frequency and night time urination.  Can vary.  1 caffeine per day.  Tried Detrol and Ditropan and Oxytrol.  Cannot exercise due to leakage of urine.   Lost 6 pounds in one year.  Hgb A1c was 7.7.  PCP:   [facility] - will see on 05/16/15.    Patient's last menstrual period was 06/28/2010.          Sexually active: No.  The current method of family planning is abstinence.    Exercising: Yes.    Treadmill, elliptical, weights, strenght exercises Smoker:  no  Health Maintenance: Pap:  02/07/11 Neg History of abnormal Pap:  Yes.  Diagnosis of condyloma. Had colposcopy. MMG:  10/17/14 BIRADS2:Benign  Self Breast Exam: yes, occ Colonoscopy:  03/13/10 Normal - repeat 10 years  BMD:   10/15/13   Result  - borderline osteoporosis. Not doing treatment.  TDaP:  2009 Screening Labs:  Hb today: PCP, Urine today: Clear   reports that she has never smoked. She has never used smokeless tobacco. She reports that she does not drink alcohol or use illicit drugs.  Past Medical History  Diagnosis Date  . Cancer 2005    breast cancer, right  . Hypertension   . Urinary incontinence   . Osteoporosis     and osteopenia  . Diabetes mellitus without complication   . Hypercholesteremia     Past Surgical History  Procedure Laterality Date  . Breast surgery Right 2005    lumpectomy  . Cesarean section  1991    Current Outpatient Prescriptions  Medication Sig Dispense Refill  . Calcium Carbonate-Vit D-Min (CALTRATE 600+D PLUS MINIS PO) Take by mouth.    . Cholecalciferol (VITAMIN D) 2000 UNITS CAPS Take 1 capsule by mouth daily.    . Coenzyme Q10 (COQ10) 100 MG CAPS Take 100 mg by mouth daily.    . ONGLYZA 5 MG TABS tablet Take 5 mg by mouth daily.  6  . pravastatin (PRAVACHOL)  20 MG tablet Take 20 mg by mouth daily.    . ranitidine (ZANTAC) 150 MG tablet Take 150 mg by mouth daily.    . TOPROL XL 100 MG 24 hr tablet Take 1 tablet by mouth daily.     No current facility-administered medications for this visit.    Family History  Problem Relation Age of Onset  . Heart disease Mother   . Diabetes Father   . Breast cancer Sister 12    bilateral breast cancer  . Hypertension Brother     ROS:  Pertinent items are noted in HPI.  Otherwise, a comprehensive ROS was negative.  Exam:   BP 112/62 mmHg  Pulse 80  Resp 16  Ht 4' 9.5" (1.461 m)  Wt 117 lb (53.071 kg)  BMI 24.86 kg/m2  LMP 06/28/2010    General appearance: alert, cooperative and appears stated age Head: Normocephalic, without obvious abnormality, atraumatic Neck: no adenopathy, supple, symmetrical, trachea midline and thyroid normal to inspection and palpation Lungs: clear to auscultation bilaterally Breasts: normal appearance, no masses or tenderness, Inspection negative, No nipple retraction or dimpling, No nipple discharge or bleeding, No axillary or supraclavicular adenopathy of left breast.  Right with scars.  No palpable nodes or masses or axillary adenopathy.  Heart: regular rate and rhythm Abdomen:  soft, non-tender; bowel sounds normal; no masses,  no organomegaly Extremities: extremities normal, atraumatic, no cyanosis or edema Skin: Skin color, texture, turgor normal. No rashes or lesions Lymph nodes: Cervical, supraclavicular, and axillary nodes normal. No abnormal inguinal nodes palpated Neurologic: Grossly normal  Pelvic: External genitalia:  no lesions              Urethra:  normal appearing urethra with no masses, tenderness or lesions              Bartholins and Skenes: normal                 Vagina: normal appearing vagina with normal color and discharge, no lesions              Cervix: no lesions              Pap taken: Yes.   Bimanual Exam:  Uterus:  normal size, contour,  position, consistency, mobility, non-tender              Adnexa: normal adnexa and no mass, fullness, tenderness              Rectovaginal: Yes.  .  Confirms.              Anus:  normal sphincter tone, no lesions  Chaperone was present for exam.  Assessment:   Well woman visit with normal exam. Mixed incontinence.  Hx right breat cancer.  Osteoporosis - borderline.  No treatment. Followed by PCP. DM.  Plan: Yearly mammogram recommended after age 83.  Recommended self breast exam.  Pap and HR HPV as above. Discussed Calcium, Vitamin D, regular exercise program including cardiovascular and weight bearing exercise. Labs performed.  No..   See orders. Refills given on medications.  No..    Discussed medication for overactive bladder and physical therapy for bladder control.  Patient wants [facility] to [facility].  This is done.  Still considering surgery.  Discussed Evista for osteoporosis.  Patient will follow up with PCP.  Follow up annually and prn.   After visit summary provided.   __10_____ minutes face to face time of which over 50% was spent in counseling regarding mixed incontinence and osteoporosis.

## 2015-05-03 LAB — IPS PAP TEST WITH HPV

## 2015-05-11 ENCOUNTER — Telehealth: Payer: Self-pay | Admitting: Obstetrics and Gynecology

## 2015-05-11 NOTE — Telephone Encounter (Signed)
Left voicemail regarding referral appointment. The information is listed below. Should the patient need to cancel or reschedule this appointment, please advise them to call the office they've been referred to in order to reschedule.  Scheduled with Ileana Roup on 05/22/15 @ [facility] [address] [place]  Please arrive 15 minutes early to check in, bring insurance card, photo id and list of medications

## 2015-05-17 ENCOUNTER — Other Ambulatory Visit: Payer: Self-pay

## 2015-05-17 DIAGNOSIS — Z1231 Encounter for screening mammogram for malignant neoplasm of breast: Secondary | ICD-10-CM

## 2015-06-01 ENCOUNTER — Other Ambulatory Visit: Payer: Self-pay | Admitting: Internal Medicine

## 2015-06-01 DIAGNOSIS — M810 Age-related osteoporosis without current pathological fracture: Secondary | ICD-10-CM

## 2015-10-24 ENCOUNTER — Inpatient Hospital Stay: Admission: RE | Admit: 2015-10-24 | Payer: Self-pay | Source: Ambulatory Visit

## 2015-10-24 ENCOUNTER — Ambulatory Visit
Admission: RE | Admit: 2015-10-24 | Discharge: 2015-10-24 | Disposition: A | Discharge status: Home / Self Care | Payer: BLUE CROSS/BLUE SHIELD | Source: Ambulatory Visit

## 2015-10-24 DIAGNOSIS — Z1231 Encounter for screening mammogram for malignant neoplasm of breast: Secondary | ICD-10-CM

## 2015-11-27 ENCOUNTER — Ambulatory Visit
Admission: RE | Admit: 2015-11-27 | Discharge: 2015-11-27 | Disposition: A | Discharge status: Home / Self Care | Payer: BLUE CROSS/BLUE SHIELD | Source: Ambulatory Visit | Attending: Internal Medicine | Admitting: Internal Medicine

## 2015-11-27 DIAGNOSIS — M810 Age-related osteoporosis without current pathological fracture: Secondary | ICD-10-CM

## 2016-03-22 ENCOUNTER — Other Ambulatory Visit: Payer: Self-pay

## 2016-03-22 DIAGNOSIS — Z1231 Encounter for screening mammogram for malignant neoplasm of breast: Secondary | ICD-10-CM

## 2016-05-08 ENCOUNTER — Ambulatory Visit: Payer: BLUE CROSS/BLUE SHIELD | Admitting: Obstetrics and Gynecology

## 2016-05-10 ENCOUNTER — Encounter: Payer: Self-pay | Admitting: Obstetrics and Gynecology

## 2016-05-10 ENCOUNTER — Ambulatory Visit (INDEPENDENT_AMBULATORY_CARE_PROVIDER_SITE_OTHER): Payer: BLUE CROSS/BLUE SHIELD | Admitting: Obstetrics and Gynecology

## 2016-05-10 VITALS — BP 146/62 | HR 72 | Resp 18 | Ht <= 58 in | Wt 116.0 lb

## 2016-05-10 DIAGNOSIS — Z0000 Encounter for general adult medical examination without abnormal findings: Secondary | ICD-10-CM

## 2016-05-10 DIAGNOSIS — N3946 Mixed incontinence: Secondary | ICD-10-CM

## 2016-05-10 DIAGNOSIS — Z01419 Encounter for gynecological examination (general) (routine) without abnormal findings: Secondary | ICD-10-CM | POA: Diagnosis not present

## 2016-05-10 LAB — POCT URINALYSIS DIPSTICK
BILIRUBIN UA: NEGATIVE
GLUCOSE UA: NEGATIVE
Ketones, UA: NEGATIVE
LEUKOCYTES UA: NEGATIVE
Nitrite, UA: NEGATIVE
Protein, UA: NEGATIVE
RBC UA: NEGATIVE
UROBILINOGEN UA: NEGATIVE
pH, UA: 5

## 2016-05-10 MED ORDER — TROSPIUM CHLORIDE ER 60 MG PO CP24: 60.0000 mg | ORAL_CAPSULE | Freq: Every day | ORAL | Status: DC

## 2016-05-10 NOTE — Patient Instructions (Signed)
Health Maintenance, Female Adopting a healthy lifestyle and getting preventive care can go a long way to promote health and wellness. Talk with your health care provider about what schedule of regular examinations is right for you. This is a good chance for you to check in with your provider about disease prevention and staying healthy. In between checkups, there are plenty of things you can do on your own. Experts have done a lot of research about which lifestyle changes and preventive measures are most likely to keep you healthy. Ask your health care provider for more information. WEIGHT AND DIET  Eat a healthy diet  Be sure to include plenty of vegetables, fruits, low-fat dairy products, and lean protein.  Do not eat a lot of foods high in solid fats, added sugars, or salt.  Get regular exercise. This is one of the most important things you can do for your health.  Most adults should exercise for at least 150 minutes each week. The exercise should increase your heart rate and make you sweat (moderate-intensity exercise).  Most adults should also do strengthening exercises at least twice a week. This is in addition to the moderate-intensity exercise.  Maintain a healthy weight  Body mass index (BMI) is a measurement that can be used to identify possible weight problems. It estimates body fat based on height and weight. Your health care provider can help determine your BMI and help you achieve or maintain a healthy weight.  For females 20 years of age and older:   A BMI below 18.5 is considered underweight.  A BMI of 18.5 to 24.9 is normal.  A BMI of 25 to 29.9 is considered overweight.  A BMI of 30 and above is considered obese.  Watch levels of cholesterol and blood lipids  You should start having your blood tested for lipids and cholesterol at 58 years of age, then have this test every 5 years.  You may need to have your cholesterol levels checked more often if:  Your lipid  or cholesterol levels are high.  You are older than 58 years of age.  You are at high risk for heart disease.  CANCER SCREENING   Lung Cancer  Lung cancer screening is recommended for adults 55-80 years old who are at high risk for lung cancer because of a history of smoking.  A yearly low-dose CT scan of the lungs is recommended for people who:  Currently smoke.  Have quit within the past 15 years.  Have at least a 30-pack-year history of smoking. A pack year is smoking an average of one pack of cigarettes a day for 1 year.  Yearly screening should continue until it has been 15 years since you quit.  Yearly screening should stop if you develop a health problem that would prevent you from having lung cancer treatment.  Breast Cancer  Practice breast self-awareness. This means understanding how your breasts normally appear and feel.  It also means doing regular breast self-exams. Let your health care provider know about any changes, no matter how small.  If you are in your 20s or 30s, you should have a clinical breast exam (CBE) by a health care provider every 1-3 years as part of a regular health exam.  If you are 40 or older, have a CBE every year. Also consider having a breast X-ray (mammogram) every year.  If you have a family history of breast cancer, talk to your health care provider about genetic screening.  If you   are at high risk for breast cancer, talk to your health care provider about having an MRI and a mammogram every year.  Breast cancer gene (BRCA) assessment is recommended for women who have family members with BRCA-related cancers. BRCA-related cancers include:  Breast.  Ovarian.  Tubal.  Peritoneal cancers.  Results of the assessment will determine the need for genetic counseling and BRCA1 and BRCA2 testing. Cervical Cancer Your health care provider may recommend that you be screened regularly for cancer of the pelvic organs (ovaries, uterus, and  vagina). This screening involves a pelvic examination, including checking for microscopic changes to the surface of your cervix (Pap test). You may be encouraged to have this screening done every 3 years, beginning at age 21.  For women ages 30-65, health care providers may recommend pelvic exams and Pap testing every 3 years, or they may recommend the Pap and pelvic exam, combined with testing for human papilloma virus (HPV), every 5 years. Some types of HPV increase your risk of cervical cancer. Testing for HPV may also be done on women of any age with unclear Pap test results.  Other health care providers may not recommend any screening for nonpregnant women who are considered low risk for pelvic cancer and who do not have symptoms. Ask your health care provider if a screening pelvic exam is right for you.  If you have had past treatment for cervical cancer or a condition that could lead to cancer, you need Pap tests and screening for cancer for at least 20 years after your treatment. If Pap tests have been discontinued, your risk factors (such as having a new sexual partner) need to be reassessed to determine if screening should resume. Some women have medical problems that increase the chance of getting cervical cancer. In these cases, your health care provider may recommend more frequent screening and Pap tests. Colorectal Cancer  This type of cancer can be detected and often prevented.  Routine colorectal cancer screening usually begins at 58 years of age and continues through 58 years of age.  Your health care provider may recommend screening at an earlier age if you have risk factors for colon cancer.  Your health care provider may also recommend using home test kits to check for hidden blood in the stool.  A small camera at the end of a tube can be used to examine your colon directly (sigmoidoscopy or colonoscopy). This is done to check for the earliest forms of colorectal  cancer.  Routine screening usually begins at age 50.  Direct examination of the colon should be repeated every 5-10 years through 58 years of age. However, you may need to be screened more often if early forms of precancerous polyps or small growths are found. Skin Cancer  Check your skin from head to toe regularly.  Tell your health care provider about any new moles or changes in moles, especially if there is a change in a mole's shape or color.  Also tell your health care provider if you have a mole that is larger than the size of a pencil eraser.  Always use sunscreen. Apply sunscreen liberally and repeatedly throughout the day.  Protect yourself by wearing long sleeves, pants, a wide-brimmed hat, and sunglasses whenever you are outside. HEART DISEASE, DIABETES, AND HIGH BLOOD PRESSURE   High blood pressure causes heart disease and increases the risk of stroke. High blood pressure is more likely to develop in:  People who have blood pressure in the high end   of the normal range (130-139/85-89 mm Hg).  People who are overweight or obese.  People who are African American.  If you are 38-23 years of age, have your blood pressure checked every 3-5 years. If you are 61 years of age or older, have your blood pressure checked every year. You should have your blood pressure measured twice--once when you are at a hospital or clinic, and once when you are not at a hospital or clinic. Record the average of the two measurements. To check your blood pressure when you are not at a hospital or clinic, you can use:  An automated blood pressure machine at a pharmacy.  A home blood pressure monitor.  If you are between 45 years and 39 years old, ask your health care provider if you should take aspirin to prevent strokes.  Have regular diabetes screenings. This involves taking a blood sample to check your fasting blood sugar level.  If you are at a normal weight and have a low risk for diabetes,  have this test once every three years after 58 years of age.  If you are overweight and have a high risk for diabetes, consider being tested at a younger age or more often. PREVENTING INFECTION  Hepatitis B  If you have a higher risk for hepatitis B, you should be screened for this virus. You are considered at high risk for hepatitis B if:  You were born in a country where hepatitis B is common. Ask your health care provider which countries are considered high risk.  Your parents were born in a high-risk country, and you have not been immunized against hepatitis B (hepatitis B vaccine).  You have HIV or AIDS.  You use needles to inject street drugs.  You live with someone who has hepatitis B.  You have had sex with someone who has hepatitis B.  You get hemodialysis treatment.  You take certain medicines for conditions, including cancer, organ transplantation, and autoimmune conditions. Hepatitis C  Blood testing is recommended for:  Everyone born from [birth year] through 1963.  Anyone with known risk factors for hepatitis C. Sexually transmitted infections (STIs)  You should be screened for sexually transmitted infections (STIs) including gonorrhea and chlamydia if:  You are sexually active and are younger than 58 years of age.  You are older than 58 years of age and your health care provider tells you that you are at risk for this type of infection.  Your sexual activity has changed since you were last screened and you are at an increased risk for chlamydia or gonorrhea. Ask your health care provider if you are at risk.  If you do not have HIV, but are at risk, it may be recommended that you take a prescription medicine daily to prevent HIV infection. This is called pre-exposure prophylaxis (PrEP). You are considered at risk if:  You are sexually active and do not regularly use condoms or know the HIV status of your partner(s).  You take drugs by injection.  You are sexually  active with a partner who has HIV. Talk with your health care provider about whether you are at high risk of being infected with HIV. If you choose to begin PrEP, you should first be tested for HIV. You should then be tested every 3 months for as long as you are taking PrEP.  PREGNANCY   If you are premenopausal and you may become pregnant, ask your health care provider about preconception counseling.  If you may  become pregnant, take 400 to 800 micrograms (mcg) of folic acid every day.  If you want to prevent pregnancy, talk to your health care provider about birth control (contraception). OSTEOPOROSIS AND MENOPAUSE   Osteoporosis is a disease in which the bones lose minerals and strength with aging. This can result in serious bone fractures. Your risk for osteoporosis can be identified using a bone density scan.  If you are 51 years of age or older, or if you are at risk for osteoporosis and fractures, ask your health care provider if you should be screened.  Ask your health care provider whether you should take a calcium or vitamin D supplement to lower your risk for osteoporosis.  Menopause may have certain physical symptoms and risks.  Hormone replacement therapy may reduce some of these symptoms and risks. Talk to your health care provider about whether hormone replacement therapy is right for you.  HOME CARE INSTRUCTIONS   Schedule regular health, dental, and eye exams.  Stay current with your immunizations.   Do not use any tobacco products including cigarettes, chewing tobacco, or electronic cigarettes.  If you are pregnant, do not drink alcohol.  If you are breastfeeding, limit how much and how often you drink alcohol.  Limit alcohol intake to no more than 1 drink per day for nonpregnant women. One drink equals 12 ounces of beer, 5 ounces of wine, or 1 ounces of hard liquor.  Do not use street drugs.  Do not share needles.  Ask your health care provider for help if  you need support or information about quitting drugs.  Tell your health care provider if you often feel depressed.  Tell your health care provider if you have ever been abused or do not feel safe at home.   This information is not intended to replace advice given to you by your health care provider. Make sure you discuss any questions you have with your health care provider.   Document Released: 04/29/2011 Document Revised: 11/04/2014 Document Reviewed: 09/15/2013 Elsevier Interactive Patient Education 2016 Elsevier Inc.  Fesoterodine extended-release tablets What is this medicine? FESOTERODINE (fes oh TER oh deen) is used to treat overactive bladder. This medicine reduces the amount of bathroom visits. This medicine may be used for other purposes; ask your health care provider or pharmacist if you have questions. What should I tell my health care provider before I take this medicine? They need to know if you have any of these conditions: -difficulty passing urine -glaucoma -intestinal obstruction -kidney disease -liver disease -an unusual or allergic reaction to fesoterodine, tolterodine, other medicines, foods, dyes, or preservatives -pregnant or trying to get pregnant -breast-feeding How should I use this medicine? Take this medicine by mouth with a glass of water. Follow the directions on the prescription label. Do not cut, crush or chew this medicine. Take your doses at regular intervals. Do not take your medicine more often than directed. Talk to your pediatrician regarding the use of this medicine in children. Special care may be needed. Overdosage: If you think you have taken too much of this medicine contact a poison control center or emergency room at once. NOTE: This medicine is only for you. Do not share this medicine with others. What if I miss a dose? If you miss a dose, take it as soon as you can. If it is almost time for your next dose, take only that dose. Do not take  double or extra doses. What may interact with this medicine? -antihistamines  for allergy, cough and cold -atropine -certain medicines for bladder problems like oxybutynin, tolterodine -certain medicines for Parkinson's disease like benztropine, trihexyphenidyl -certain medicines for stomach problems like dicyclomine, hyoscyamine -certain medicines for travel sickness like scopolamine -clarithromycin -ipratropium -itraconazole -ketoconazole -rifampin This list may not describe all possible interactions. Give your health care provider a list of all the medicines, herbs, non-prescription drugs, or dietary supplements you use. Also tell them if you smoke, drink alcohol, or use illegal drugs. Some items may interact with your medicine. What should I watch for while using this medicine? It may take 2 or 3 months to notice the full benefit from this medicine. Your health care professional may also recommend techniques that may help improve control of your bladder and sphincter muscles. These techniques will help you need the bathroom less frequently. You may need to limit your intake of tea, coffee, caffeinated sodas, and alcohol. These drinks may make your symptoms worse. Keeping healthy bowel habits may lessen bladder symptoms. If you currently smoke, quitting smoking may help reduce irritation to the bladder muscle. You may get drowsy or dizzy. Do not drive, use machinery, or do anything that needs mental alertness until you know how this drug affects you. Do not stand or sit up quickly, especially if you are an older patient. This reduces the risk of dizzy or fainting spells. Your mouth may get dry. Chewing sugarless gum or sucking hard candy and drinking plenty of water will help. This medicine may cause dry eyes and blurred vision. If you wear contact lenses you may feel some discomfort. Lubricating drops may help. See your eye doctor if the problem does not go away or is severe. What side effects  may I notice from receiving this medicine? Side effects that you should report to your doctor or health care professional as soon as possible: -allergic reactions like skin rash, itching or hives, swelling of the face, lips, or tongue -breathing problems -chest pain -fast, irregular heartbeat -fever -swelling of the ankles, feet, hands -trouble passing urine or change in the amount of urine Side effects that usually do not require medical attention (report to your doctor or health care professional if they continue or are bothersome): -changes in vision -constipation -dizziness -dry eyes or mouth -nausea -stomach upset -tiredness This list may not describe all possible side effects. Call your doctor for medical advice about side effects. You may report side effects to FDA at 1-800-FDA-1088. Where should I keep my medicine? Keep out of the reach of children. Store at room temperature between 15 and 30 degrees C (59 and 86 degrees F). Protect from moisture. Throw away any unused medicine after the expiration date. NOTE: This sheet is a summary. It may not cover all possible information. If you have questions about this medicine, talk to your doctor, pharmacist, or health care provider.    2016, Elsevier/Gold Standard. (2009-12-13 12:25:12)

## 2016-05-10 NOTE — Progress Notes (Signed)
60 y.o. G11P1001 Married [place] female here for annual exam.    Hgb A1C is 7.1.  Working for [employer] for 18 years.   PCP: Dr. Dagmar Hait  Patient's last menstrual period was 06/28/2010.           Sexually active: No.  The current method of family planning is abstinence.    Exercising: Yes.    Walking, gym Smoker:  no  Health Maintenance: Pap:  04/28/15 Neg. HR HPV:neg History of abnormal Pap:  Yes, Condyloma.  MMG:  10/25/15 BIRADS1:neg.  3D. Colonoscopy:  03/13/10 Diverticulosis - f/u 10 years  BMD:   11/17/15  Result  Osteoporosis of hip and spine - w/ Dr. Dagmar Hait TDaP:  07/2008  HIV:  Done years ago Hep C: Done years ago.  Screening Labs:  Hb today: PCP, Urine today: Negative   reports that she has never smoked. She has never used smokeless tobacco. She reports that she does not drink alcohol or use illicit drugs.  Past Medical History  Diagnosis Date  . Cancer 2005    breast cancer, right  . Hypertension   . Urinary incontinence   . Osteoporosis     and osteopenia  . Diabetes mellitus without complication   . Hypercholesteremia     Past Surgical History  Procedure Laterality Date  . Breast surgery Right 2005    lumpectomy  . Cesarean section  1991    Current Outpatient Prescriptions  Medication Sig Dispense Refill  . acetaminophen (TYLENOL) 325 MG tablet Take 650 mg by mouth as needed.    . Calcium Carbonate-Vit D-Min (CALTRATE 600+D PLUS MINIS PO) Take by mouth.    . Cholecalciferol (VITAMIN D) 2000 UNITS CAPS Take 1 capsule by mouth daily.    . Coenzyme Q10 (COQ10) 100 MG CAPS Take 100 mg by mouth daily.    . ONGLYZA 5 MG TABS tablet Take 5 mg by mouth daily.  6  . pravastatin (PRAVACHOL) 20 MG tablet Take 20 mg by mouth daily.    . ranitidine (ZANTAC) 150 MG tablet Take 150 mg by mouth as needed.     . TOPROL XL 100 MG 24 hr tablet Take 1 tablet by mouth daily.     No current facility-administered medications for this visit.    Family History  Problem  Relation Age of Onset  . Heart disease Mother   . Diabetes Father   . Breast cancer Sister 84    bilateral breast cancer  . Hypertension Brother     ROS:  Pertinent items are noted in HPI.  Otherwise, a comprehensive ROS was negative.  Exam:   BP 146/62 mmHg  Pulse 72  Resp 18  Ht 4' 9.5" (1.461 m)  Wt 116 lb (52.617 kg)  BMI 24.65 kg/m2  LMP 06/28/2010    General appearance: alert, cooperative and appears stated age Head: Normocephalic, without obvious abnormality, atraumatic Neck: no adenopathy, supple, symmetrical, trachea midline and thyroid normal to inspection and palpation Lungs: clear to auscultation bilaterally Breasts: normal appearance, no masses or tenderness, Inspection negative, No nipple retraction or dimpling, No nipple discharge or bleeding, No axillary or supraclavicular adenopathy on left.  Scar on right breast.  No dominant masses, nipple discharge or axillary adenopathy. Heart: regular rate and rhythm Abdomen: incisions:  Yes.    Pfannenstiel incision, soft, non-tender; no masses, no organomegaly Extremities: extremities normal, atraumatic, no cyanosis or edema Skin: Skin color, texture, turgor normal. No rashes or lesions Lymph nodes: Cervical, supraclavicular, and axillary nodes  normal. No abnormal inguinal nodes palpated Neurologic: Grossly normal  Pelvic: External genitalia:  no lesions              Urethra:  normal appearing urethra with no masses, tenderness or lesions              Bartholins and Skenes: normal                 Vagina: normal appearing vagina with normal color and discharge, no lesions              Cervix: no lesions              Pap taken: No. Bimanual Exam:  Uterus:  normal size, contour, position, consistency, mobility, non-tender              Adnexa: normal adnexa and no mass, fullness, tenderness              Rectal exam: Yes.  .  Confirms.              Anus:  normal sphincter tone, no lesions  Chaperone was present for  exam.  Assessment:   Well woman visit with normal exam. Hx right breast cancer.  Osteoporosis.  Not treated. DM.  Hx mixed incontinence.  HTN.  Plan: Yearly mammogram recommended after age 4.  Recommended self breast exam.  Pap and HR HPV as above. Discussed Calcium, Vitamin D, regular exercise program including cardiovascular and weight bearing exercise. Labs performed.  No..     Prescription medication(s) given.  Yes.  .  See orders.  Sanctura XL 60 mg daily.  Discussed Impressa and incontinence dish.  She will consider.  Follow up annually and prn.       After visit summary provided.

## 2016-09-06 DIAGNOSIS — M810 Age-related osteoporosis without current pathological fracture: Secondary | ICD-10-CM | POA: Diagnosis not present

## 2016-09-06 DIAGNOSIS — E784 Other hyperlipidemia: Secondary | ICD-10-CM | POA: Diagnosis not present

## 2016-09-06 DIAGNOSIS — E119 Type 2 diabetes mellitus without complications: Secondary | ICD-10-CM | POA: Diagnosis not present

## 2016-09-06 DIAGNOSIS — I10 Essential (primary) hypertension: Secondary | ICD-10-CM | POA: Diagnosis not present

## 2016-09-13 DIAGNOSIS — E784 Other hyperlipidemia: Secondary | ICD-10-CM | POA: Diagnosis not present

## 2016-09-13 DIAGNOSIS — N3281 Overactive bladder: Secondary | ICD-10-CM | POA: Diagnosis not present

## 2016-09-13 DIAGNOSIS — Z1389 Encounter for screening for other disorder: Secondary | ICD-10-CM | POA: Diagnosis not present

## 2016-09-13 DIAGNOSIS — Z0000 Encounter for general adult medical examination without abnormal findings: Secondary | ICD-10-CM | POA: Diagnosis not present

## 2016-09-13 DIAGNOSIS — E119 Type 2 diabetes mellitus without complications: Secondary | ICD-10-CM | POA: Diagnosis not present

## 2016-09-13 DIAGNOSIS — I10 Essential (primary) hypertension: Secondary | ICD-10-CM | POA: Diagnosis not present

## 2016-10-24 ENCOUNTER — Ambulatory Visit
Admission: RE | Admit: 2016-10-24 | Discharge: 2016-10-24 | Disposition: A | Discharge status: Home / Self Care | Payer: BLUE CROSS/BLUE SHIELD | Source: Ambulatory Visit

## 2016-10-24 DIAGNOSIS — Z1231 Encounter for screening mammogram for malignant neoplasm of breast: Secondary | ICD-10-CM

## 2016-12-06 ENCOUNTER — Ambulatory Visit (INDEPENDENT_AMBULATORY_CARE_PROVIDER_SITE_OTHER): Payer: Self-pay | Admitting: Nurse Practitioner

## 2016-12-06 ENCOUNTER — Encounter: Payer: Self-pay | Admitting: Nurse Practitioner

## 2016-12-06 VITALS — BP 120/62 | HR 84 | Temp 100.8°F | Wt 117.0 lb

## 2016-12-06 DIAGNOSIS — R6889 Other general symptoms and signs: Secondary | ICD-10-CM

## 2016-12-06 DIAGNOSIS — R509 Fever, unspecified: Secondary | ICD-10-CM

## 2016-12-06 MED ORDER — FLUTICASONE PROPIONATE 50 MCG/ACT NA SUSP: 2.0000 | Freq: Every day | NASAL | 6 refills | Status: DC

## 2016-12-06 MED ORDER — OSELTAMIVIR PHOSPHATE 75 MG PO CAPS: 75.0000 mg | ORAL_CAPSULE | Freq: Two times a day (BID) | ORAL | 0 refills | Status: AC

## 2016-12-06 MED ORDER — BENZONATATE 100 MG PO CAPS: 100.0000 mg | ORAL_CAPSULE | Freq: Two times a day (BID) | ORAL | 0 refills | Status: AC | PRN

## 2016-12-06 MED ORDER — HYDROCODONE-HOMATROPINE 5-1.5 MG/5ML PO SYRP: 5.0000 mL | ORAL_SOLUTION | Freq: Three times a day (TID) | ORAL | 0 refills | Status: AC | PRN

## 2016-12-06 NOTE — Patient Instructions (Addendum)

## 2016-12-06 NOTE — Progress Notes (Signed)
Subjective:     Andrea [name] is a 61 y.o. female who presents for evaluation of influenza like symptoms. Symptoms include chills, headache, myalgias, clear nasal discharge, productive cough, sore throat and fever and have been present for 2 days. She has tried to alleviate the symptoms with acetaminophen with minimal relief. High risk factors for influenza complications: none.    The following portions of the patient's history were reviewed and updated as appropriate: allergies, current medications and past medical history.  Review of Systems Constitutional: positive for fatigue and fevers, negative for chills, night sweats and weight loss Eyes: negative Ears, nose, mouth, throat, and face: positive for nasal congestion and sore throat, negative for ear drainage and earaches Respiratory: positive for cough Cardiovascular: negative Gastrointestinal: negative Neurological: positive for headaches     Objective:    BP 120/62   Pulse 84   Temp (!) 100.8 F (38.2 C)   Wt 117 lb (53.1 kg)   LMP 06/28/2010   SpO2 97%   BMI 24.88 kg/m  General appearance: alert, cooperative and no distress Head: Normocephalic, without obvious abnormality, atraumatic Eyes: conjunctivae/corneas clear. PERRL, EOM's intact. Fundi benign. Ears: normal TM and external ear canal left ear and abnormal TM right ear - history of ruptured TM Nose: clear discharge, mild congestion, no sinus tenderness Throat: mild tonsillar erythema Lungs: clear to auscultation bilaterally Heart: regular rate and rhythm, S1, S2 normal, no murmur, click, rub or gallop Neurologic: Alert and oriented X 3, normal strength and tone. Normal symmetric reflexes. Normal coordination and gait    Assessment:    Influenza like syndrome    Plan:    Supportive care with appropriate antipyretics and fluids. Antivirals per orders.   Rest, hydration and fluids.   Take Tamiflu if symptoms worsen. Use prescribed medications for cough and  nasal symptoms as directed.   Subjective:    Patient ID: Andrea [name], female    DOB: [birth year], 61 y.o.   MRN: [id number]  HPI    Review of Systems     Objective:   Physical Exam        Assessment & Plan:

## 2016-12-09 ENCOUNTER — Telehealth: Payer: Self-pay | Admitting: Nurse Practitioner

## 2016-12-09 NOTE — Telephone Encounter (Signed)
Called patient to follow up.  Reached voicemail, left message for her to return call.  

## 2016-12-10 ENCOUNTER — Telehealth: Payer: Self-pay | Admitting: Nurse Practitioner

## 2016-12-10 NOTE — Telephone Encounter (Signed)
Patient called to provide update on her status.  Patient states she is much better but still coughing and had post-nasal drip.  Informed patient she could use OTC claritin.  Informed patient cough could last 3-4 weeks after symptoms resolve.  Patient verbalized understanding.

## 2017-03-14 DIAGNOSIS — I10 Essential (primary) hypertension: Secondary | ICD-10-CM | POA: Diagnosis not present

## 2017-03-14 DIAGNOSIS — E119 Type 2 diabetes mellitus without complications: Secondary | ICD-10-CM | POA: Diagnosis not present

## 2017-03-14 DIAGNOSIS — E784 Other hyperlipidemia: Secondary | ICD-10-CM | POA: Diagnosis not present

## 2017-03-14 DIAGNOSIS — N3281 Overactive bladder: Secondary | ICD-10-CM | POA: Diagnosis not present

## 2017-03-14 DIAGNOSIS — F438 Other reactions to severe stress: Secondary | ICD-10-CM | POA: Diagnosis not present

## 2017-04-04 ENCOUNTER — Other Ambulatory Visit: Payer: Self-pay | Admitting: Internal Medicine

## 2017-04-04 DIAGNOSIS — Z1231 Encounter for screening mammogram for malignant neoplasm of breast: Secondary | ICD-10-CM

## 2017-05-16 ENCOUNTER — Ambulatory Visit: Payer: BLUE CROSS/BLUE SHIELD | Admitting: Obstetrics and Gynecology

## 2017-05-23 ENCOUNTER — Encounter: Payer: Self-pay | Admitting: Obstetrics and Gynecology

## 2017-05-23 ENCOUNTER — Ambulatory Visit (INDEPENDENT_AMBULATORY_CARE_PROVIDER_SITE_OTHER): Payer: BLUE CROSS/BLUE SHIELD | Admitting: Obstetrics and Gynecology

## 2017-05-23 VITALS — BP 122/68 | HR 84 | Resp 16 | Ht <= 58 in | Wt 117.0 lb

## 2017-05-23 DIAGNOSIS — Z01419 Encounter for gynecological examination (general) (routine) without abnormal findings: Secondary | ICD-10-CM | POA: Diagnosis not present

## 2017-05-23 NOTE — Patient Instructions (Signed)

## 2017-05-23 NOTE — Progress Notes (Signed)
61 y.o. G68P1001 Separated Asian female here for annual exam.    Still with incontinence.  This limits her exercise. Also has some nocturia. States she voids well.  She did not take [place].  Did not like Mybetriq that Dr. Dagmar Hait prescribed. Did not want to do physical therapy.  Did urodynamic testing on 02/22/15.  Showed mixed incontinence.   Denies postmenopausal bleeding.    Hemoglobin A1C was 6.something per patient.   Husband has initiated divorce. Daughter is supportive of her. No third person involved.   PCP:  Dr. Dagmar Hait   Patient's last menstrual period was 06/28/2010.           Sexually active: No.  The current method of family planning is abstinence.    Exercising: Yes.    gym 2-3 times a week Smoker:  no  Health Maintenance: Pap:  04/28/15 Neg. HR HPV:neg History of abnormal Pap:  Yes, Condyloma  MMG:  10/24/16 BIRADS 2 benign/density c Colonoscopy:  03/13/10 Diverticulosis - f/u 10 years  BMD:   11/17/15  Result  Osteoporosis of hip and spine - w/ Dr. Dagmar Hait TDaP:  07/2008 HIV: done years ago Hep C: not done  Screening Labs:  PCP takes care of labs   reports that she has never smoked. She has never used smokeless tobacco. She reports that she does not drink alcohol or use drugs.  Past Medical History:  Diagnosis Date  . Cancer [facility]) 2005   breast cancer, right  . Diabetes mellitus without complication ([place])   . Hypercholesteremia   . Hypertension   . Osteoporosis    and osteopenia  . Urinary incontinence     Past Surgical History:  Procedure Laterality Date  . BREAST SURGERY Right 2005   lumpectomy  . CESAREAN SECTION  1991    Current Outpatient Prescriptions  Medication Sig Dispense Refill  . acetaminophen (TYLENOL) 325 MG tablet Take 650 mg by mouth as needed.    . Cholecalciferol (VITAMIN D) 2000 UNITS CAPS Take 1 capsule by mouth daily.    . Coenzyme Q10 (COQ10) 100 MG CAPS Take 100 mg by mouth daily.    . ONGLYZA 5 MG TABS tablet Take 5 mg by  mouth daily.  6  . pravastatin (PRAVACHOL) 40 MG tablet Take 40 mg by mouth daily.     . ranitidine (ZANTAC) 150 MG tablet Take 150 mg by mouth as needed.     . TOPROL XL 100 MG 24 hr tablet Take 1 tablet by mouth daily.    . fluticasone (FLONASE) 50 MCG/ACT nasal spray Place 2 sprays into both nostrils daily. (Patient not taking: Reported on 05/23/2017) 16 g 6   No current facility-administered medications for this visit.     Family History  Problem Relation Age of Onset  . Heart disease Mother   . Diabetes Father   . Breast cancer Sister 71       bilateral breast cancer  . Hypertension Brother     ROS:  Pertinent items are noted in HPI.  Otherwise, a comprehensive ROS was negative.  Exam:   BP 122/68 (BP Location: Right Arm, Patient Position: Sitting, Cuff Size: Normal)   Pulse 84   Resp 16   Ht 4' 9.5" (1.461 m)   Wt 117 lb (53.1 kg)   LMP 06/28/2010   BMI 24.88 kg/m     General appearance: alert, cooperative and appears stated age Head: Normocephalic, without obvious abnormality, atraumatic Neck: no adenopathy, supple, symmetrical, trachea midline and thyroid  normal to inspection and palpation Lungs: clear to auscultation bilaterally Breasts: normal appearance, no masses or tenderness, No nipple retraction or dimpling, No nipple discharge or bleeding, No axillary or supraclavicular adenopathy Heart: regular rate and rhythm Abdomen: soft, non-tender; no masses, no organomegaly Extremities: extremities normal, atraumatic, no cyanosis or edema Skin: Skin color, texture, turgor normal. No rashes or lesions Lymph nodes: Cervical, supraclavicular, and axillary nodes normal. No abnormal inguinal nodes palpated Neurologic: Grossly normal  Pelvic: External genitalia:  no lesions              Urethra:  normal appearing urethra with no masses, tenderness or lesions              Bartholins and Skenes: normal                 Vagina: normal appearing vagina with normal color and  discharge, no lesions              Cervix: no lesions              Pap taken: No. Bimanual Exam:  Uterus:  normal size, contour, position, consistency, mobility, non-tender              Adnexa: no mass, fullness, tenderness              Rectal exam: Yes.  .  Confirms.              Anus:  normal sphincter tone, no lesions  Chaperone was present for exam.  Assessment:   Well woman visit with normal exam. Below is copied from my visit with pt on 05/10/16: Hx right breast cancer.  Osteoporosis.  Not treated. DM.  Hx mixed incontinence.  HTN.  Plan: Mammogram screening discussed. Recommended self breast awareness. Pap and HR HPV as above. Guidelines for Calcium, Vitamin D, regular exercise program including cardiovascular and weight bearing exercise. We briefly discussed midurethral sling/cystoscopy again.  She will call back if she is interested. Will do hep C aby with PCP. Follow up annually and prn.   After visit summary provided.

## 2017-06-24 ENCOUNTER — Other Ambulatory Visit: Payer: Self-pay | Admitting: Internal Medicine

## 2017-06-24 DIAGNOSIS — M810 Age-related osteoporosis without current pathological fracture: Secondary | ICD-10-CM

## 2017-10-17 ENCOUNTER — Encounter: Payer: Self-pay | Admitting: Emergency Medicine

## 2017-10-17 ENCOUNTER — Ambulatory Visit: Payer: Self-pay | Admitting: Emergency Medicine

## 2017-10-17 VITALS — BP 142/86 | HR 74 | Temp 98.5°F | Resp 18

## 2017-10-17 DIAGNOSIS — R350 Frequency of micturition: Secondary | ICD-10-CM

## 2017-10-17 DIAGNOSIS — N3001 Acute cystitis with hematuria: Secondary | ICD-10-CM

## 2017-10-17 LAB — POC URINALSYSI DIPSTICK (AUTOMATED)
Bilirubin, UA: NEGATIVE
Glucose, UA: 100
KETONES UA: NEGATIVE
LEUKOCYTES UA: NEGATIVE
NITRITE UA: NEGATIVE
PH UA: 6 (ref 5.0–8.0)
Spec Grav, UA: 1.025 (ref 1.010–1.025)
Urobilinogen, UA: NEGATIVE E.U./dL — AB

## 2017-10-17 MED ORDER — CIPROFLOXACIN HCL 500 MG PO TABS: 500.0000 mg | ORAL_TABLET | Freq: Two times a day (BID) | ORAL | 0 refills | Status: DC

## 2017-10-17 MED ORDER — PHENAZOPYRIDINE HCL 200 MG PO TABS: 200.0000 mg | ORAL_TABLET | Freq: Three times a day (TID) | ORAL | 0 refills | Status: DC | PRN

## 2017-10-17 MED ORDER — FLUCONAZOLE 200 MG PO TABS: ORAL_TABLET | ORAL | 1 refills | Status: DC

## 2017-10-17 NOTE — Patient Instructions (Signed)

## 2017-10-17 NOTE — Progress Notes (Signed)
SUBJECTIVE:  Andrea [name] is a 61 y.o. female who presents to the urgent care with complaint of dysuria, frequency, and urgency. She denies any nausea, vomiting, flank pain, fever, chills, or loss of appetite. She also denies any vaginal itching, discharge, odor, or pain with intercourse. Symptoms have been ongoing for  3 days.  She has not tried anything for relief. She does drink caffeine containing products.   Current Meds  Medication Sig  . acetaminophen (TYLENOL) 325 MG tablet Take 650 mg by mouth as needed.  . Cholecalciferol (VITAMIN D) 2000 UNITS CAPS Take 1 capsule by mouth daily.  . Coenzyme Q10 (COQ10) 100 MG CAPS Take 100 mg by mouth daily.  . ONGLYZA 5 MG TABS tablet Take 5 mg by mouth daily.  . pravastatin (PRAVACHOL) 40 MG tablet Take 40 mg by mouth daily.   . ranitidine (ZANTAC) 150 MG tablet Take 150 mg by mouth as needed.   . TOPROL XL 100 MG 24 hr tablet Take 1 tablet by mouth daily.   No Known Allergies  Review of Systems  Constitutional: Negative for chills and fever.  Respiratory: Negative.   Cardiovascular: Negative.   Gastrointestinal: Negative for abdominal pain, constipation, diarrhea, nausea and vomiting.  Genitourinary: Positive for dysuria, frequency and urgency. Negative for flank pain.  Musculoskeletal: Negative.   Neurological: Negative.      OBJECTIVE:   Vitals:   10/17/17 1646  BP: (!) 142/86  Pulse: 74  Resp: 18  Temp: 98.5 F (36.9 C)  TempSrc: Oral  SpO2: 95%     General appearance: alert; no distress Lungs: clear to auscultation bilaterally Heart: regular rate and rhythm Abdomen: soft, non-tender; bowel sounds normal; no masses or organomegaly; no guarding or rebound tenderness Back: no CVA tenderness Skin: warm and dry Psychological: alert and cooperative; normal mood and affect    Labs:  Results for orders placed or performed in visit on 10/17/17  POCT Urinalysis Dipstick (Automated)  Result Value Ref Range   Color,  UA yellow    Clarity, UA clear    Glucose, UA 100    Bilirubin, UA neg    Ketones, UA neg    Spec Grav, UA 1.025 1.010 - 1.025   Blood, UA trace    pH, UA 6.0 5.0 - 8.0   Protein, UA trace    Urobilinogen, UA negative (A) 0.2 or 1.0 E.U./dL   Nitrite, UA neg    Leukocytes, UA Negative Negative     ASSESSMENT & PLAN:  1. Urinary frequency   2. Acute cystitis with hematuria     Meds ordered this encounter  Medications  . ciprofloxacin (CIPRO) 500 MG tablet    Sig: Take 1 tablet (500 mg total) by mouth 2 (two) times daily.    Dispense:  14 tablet    Refill:  0    Order Specific Question:   Supervising Provider    Answer:   Ricard Dillon [[id number]]  . phenazopyridine (PYRIDIUM) 200 MG tablet    Sig: Take 1 tablet (200 mg total) by mouth 3 (three) times daily as needed for pain.    Dispense:  20 tablet    Refill:  0    Order Specific Question:   Supervising Provider    Answer:   Ricard Dillon [[id number]]  . fluconazole (DIFLUCAN) 200 MG tablet    Sig: Take 1 tablet now, than wait 3 days and take the second tablet.    Dispense:  2 tablet  Refill:  1    Order Specific Question:   Supervising Provider    Answer:   Ricard Dillon [[id number]]   Pt counseled on risks of flouroquinolone antibiotics including tendon rupture, alternatives discussed patient requested cipro. Counseling provided, return as needed. Reviewed expectations re: course of current medical issues. Questions answered. Outlined signs and symptoms indicating need for more acute intervention. Patient verbalized understanding. After Visit Summary given.  Andres Shad, MSN, FNP-BC

## 2017-10-19 ENCOUNTER — Telehealth: Payer: Self-pay | Admitting: Emergency Medicine

## 2017-10-19 NOTE — Telephone Encounter (Signed)
Patient returned called.  States she is feeling better is continuing her abx.  Instructed patient to complete the medications prescribed.  Patient verbalized understanding.

## 2017-10-27 ENCOUNTER — Ambulatory Visit
Admission: RE | Admit: 2017-10-27 | Discharge: 2017-10-27 | Disposition: A | Discharge status: Home / Self Care | Payer: BLUE CROSS/BLUE SHIELD | Source: Ambulatory Visit | Attending: Internal Medicine | Admitting: Internal Medicine

## 2017-10-27 DIAGNOSIS — Z1231 Encounter for screening mammogram for malignant neoplasm of breast: Secondary | ICD-10-CM | POA: Diagnosis not present

## 2017-10-27 HISTORY — DX: Personal history of irradiation: Z92.3

## 2017-10-31 DIAGNOSIS — M810 Age-related osteoporosis without current pathological fracture: Secondary | ICD-10-CM | POA: Diagnosis not present

## 2017-10-31 DIAGNOSIS — R82998 Other abnormal findings in urine: Secondary | ICD-10-CM | POA: Diagnosis not present

## 2017-10-31 DIAGNOSIS — Z0000 Encounter for general adult medical examination without abnormal findings: Secondary | ICD-10-CM | POA: Diagnosis not present

## 2017-10-31 DIAGNOSIS — I10 Essential (primary) hypertension: Secondary | ICD-10-CM | POA: Diagnosis not present

## 2017-10-31 DIAGNOSIS — E119 Type 2 diabetes mellitus without complications: Secondary | ICD-10-CM | POA: Diagnosis not present

## 2017-11-07 DIAGNOSIS — I10 Essential (primary) hypertension: Secondary | ICD-10-CM | POA: Diagnosis not present

## 2017-11-07 DIAGNOSIS — Z0000 Encounter for general adult medical examination without abnormal findings: Secondary | ICD-10-CM | POA: Diagnosis not present

## 2017-11-07 DIAGNOSIS — E119 Type 2 diabetes mellitus without complications: Secondary | ICD-10-CM | POA: Diagnosis not present

## 2017-11-07 DIAGNOSIS — E7849 Other hyperlipidemia: Secondary | ICD-10-CM | POA: Diagnosis not present

## 2017-11-07 DIAGNOSIS — Z1389 Encounter for screening for other disorder: Secondary | ICD-10-CM | POA: Diagnosis not present

## 2017-11-27 ENCOUNTER — Other Ambulatory Visit: Payer: BLUE CROSS/BLUE SHIELD

## 2017-11-28 DIAGNOSIS — Z1212 Encounter for screening for malignant neoplasm of rectum: Secondary | ICD-10-CM | POA: Diagnosis not present

## 2018-02-09 ENCOUNTER — Telehealth: Payer: Self-pay

## 2018-02-09 ENCOUNTER — Ambulatory Visit
Admission: RE | Admit: 2018-02-09 | Discharge: 2018-02-09 | Disposition: A | Discharge status: Home / Self Care | Payer: BLUE CROSS/BLUE SHIELD | Source: Ambulatory Visit | Attending: Internal Medicine | Admitting: Internal Medicine

## 2018-02-09 DIAGNOSIS — M810 Age-related osteoporosis without current pathological fracture: Secondary | ICD-10-CM | POA: Diagnosis not present

## 2018-02-09 DIAGNOSIS — Z780 Asymptomatic menopausal state: Secondary | ICD-10-CM | POA: Diagnosis not present

## 2018-02-09 NOTE — Telephone Encounter (Signed)
Spoke with patient. Patient verbalizes understanding. States that she would like to proceed with consultation with Endocrinology. Advised will let Dr.Silva know so that referral can be placed. Patient is agreeable.

## 2018-02-09 NOTE — Telephone Encounter (Signed)
She may see Dr. Reynold Bowen at [facility] or Dr. Dwyane Dee at [facility].

## 2018-02-09 NOTE — Telephone Encounter (Signed)
Left message to call Perina Salvaggio at [phone]. 

## 2018-02-09 NOTE — Telephone Encounter (Signed)
Patient returned call to Kaitlyn. °

## 2018-02-09 NOTE — Telephone Encounter (Signed)
-----   Message from Nunzio Cobbs, MD sent at 02/09/2018  1:45 PM EDT ----- Please report results of BMD to patient.  She has significant osteoporosis of the hip and spine.  The spine is almost at a level of severe osteoporosis.   I do recommend she pursue treatment to avoid risk of fracture and complications.   Would she like to have consultation with an endocrinologist?

## 2018-02-10 NOTE — Telephone Encounter (Signed)
Referral placed to Dr.South with [facility]. Patient is aware referrals coordinator with assist with scheduling and she will be contacted to set up an appointment date and time.  Routing to provider for final review. Patient agreeable to disposition. Will close encounter.

## 2018-02-13 ENCOUNTER — Telehealth: Payer: BLUE CROSS/BLUE SHIELD | Admitting: Nurse Practitioner

## 2018-02-13 DIAGNOSIS — N3001 Acute cystitis with hematuria: Secondary | ICD-10-CM

## 2018-02-13 MED ORDER — CIPROFLOXACIN HCL 500 MG PO TABS: 500.0000 mg | ORAL_TABLET | Freq: Two times a day (BID) | ORAL | 0 refills | Status: DC

## 2018-02-13 NOTE — Progress Notes (Signed)

## 2018-03-12 DIAGNOSIS — C50919 Malignant neoplasm of unspecified site of unspecified female breast: Secondary | ICD-10-CM | POA: Diagnosis not present

## 2018-03-12 DIAGNOSIS — Z6824 Body mass index (BMI) 24.0-24.9, adult: Secondary | ICD-10-CM | POA: Diagnosis not present

## 2018-03-12 DIAGNOSIS — M810 Age-related osteoporosis without current pathological fracture: Secondary | ICD-10-CM | POA: Diagnosis not present

## 2018-03-27 ENCOUNTER — Encounter (HOSPITAL_COMMUNITY): Payer: BLUE CROSS/BLUE SHIELD

## 2018-04-23 DIAGNOSIS — H18413 Arcus senilis, bilateral: Secondary | ICD-10-CM | POA: Diagnosis not present

## 2018-04-23 DIAGNOSIS — H2513 Age-related nuclear cataract, bilateral: Secondary | ICD-10-CM | POA: Diagnosis not present

## 2018-04-23 DIAGNOSIS — H40033 Anatomical narrow angle, bilateral: Secondary | ICD-10-CM | POA: Diagnosis not present

## 2018-04-23 DIAGNOSIS — E119 Type 2 diabetes mellitus without complications: Secondary | ICD-10-CM | POA: Diagnosis not present

## 2018-04-29 ENCOUNTER — Other Ambulatory Visit: Payer: Self-pay | Admitting: Internal Medicine

## 2018-04-29 DIAGNOSIS — Z1231 Encounter for screening mammogram for malignant neoplasm of breast: Secondary | ICD-10-CM

## 2018-05-06 DIAGNOSIS — M810 Age-related osteoporosis without current pathological fracture: Secondary | ICD-10-CM | POA: Diagnosis not present

## 2018-05-06 DIAGNOSIS — I10 Essential (primary) hypertension: Secondary | ICD-10-CM | POA: Diagnosis not present

## 2018-05-06 DIAGNOSIS — E119 Type 2 diabetes mellitus without complications: Secondary | ICD-10-CM | POA: Diagnosis not present

## 2018-05-06 DIAGNOSIS — K219 Gastro-esophageal reflux disease without esophagitis: Secondary | ICD-10-CM | POA: Diagnosis not present

## 2018-05-14 DIAGNOSIS — M810 Age-related osteoporosis without current pathological fracture: Secondary | ICD-10-CM | POA: Diagnosis not present

## 2018-05-26 NOTE — Progress Notes (Deleted)
62 y.o. G32P1001 Married [place] female here for annual exam.    PCP:     Patient's last menstrual period was 06/28/2010.           Sexually active: {yes no:314532}  The current method of family planning is {contraception:315051}.    Exercising: {yes no:314532}  {types:19826} Smoker:  {YES [id number]  Health Maintenance: Pap:  04/28/15 Neg. HR HPV:neg History of abnormal Pap:  Yes, Condyloma  MMG:  10/27/17 BIRADS 2 benign Colonoscopy:  03/13/10 Diverticulosis - f/u 10 years  BMD:   11/17/15  Result  Osteoporosis of hip and spine - w/ Dr. Dagmar Hait TDaP:  07/2008 HIV: done years ago Hep C: not done  Screening Labs:  PCP takes care of labs    reports that she has never smoked. She has never used smokeless tobacco. She reports that she does not drink alcohol or use drugs.  Past Medical History:  Diagnosis Date  . Cancer [facility]) 2005   breast cancer, right  . Diabetes mellitus without complication ([place])   . Hypercholesteremia   . Hypertension   . Osteoporosis    and osteopenia  . Personal history of radiation therapy 2005   Right Breast Cancer  . Urinary incontinence     Past Surgical History:  Procedure Laterality Date  . BREAST LUMPECTOMY Right 2005  . BREAST SURGERY Right 2005   lumpectomy  . CESAREAN SECTION  1991    Current Outpatient Medications  Medication Sig Dispense Refill  . acetaminophen (TYLENOL) 325 MG tablet Take 650 mg by mouth as needed.    . Cholecalciferol (VITAMIN D) 2000 UNITS CAPS Take 1 capsule by mouth daily.    . ciprofloxacin (CIPRO) 500 MG tablet Take 1 tablet (500 mg total) by mouth 2 (two) times daily. 14 tablet 0  . ciprofloxacin (CIPRO) 500 MG tablet Take 1 tablet (500 mg total) by mouth 2 (two) times daily. 10 tablet 0  . Coenzyme Q10 (COQ10) 100 MG CAPS Take 100 mg by mouth daily.    . fluconazole (DIFLUCAN) 200 MG tablet Take 1 tablet now, than wait 3 days and take the second tablet. 2 tablet 1  . fluticasone (FLONASE) 50 MCG/ACT nasal spray  Place 2 sprays into both nostrils daily. (Patient not taking: Reported on 05/23/2017) 16 g 6  . ONGLYZA 5 MG TABS tablet Take 5 mg by mouth daily.  6  . phenazopyridine (PYRIDIUM) 200 MG tablet Take 1 tablet (200 mg total) by mouth 3 (three) times daily as needed for pain. 20 tablet 0  . pravastatin (PRAVACHOL) 40 MG tablet Take 40 mg by mouth daily.     . ranitidine (ZANTAC) 150 MG tablet Take 150 mg by mouth as needed.     . TOPROL XL 100 MG 24 hr tablet Take 1 tablet by mouth daily.     No current facility-administered medications for this visit.     Family History  Problem Relation Age of Onset  . Heart disease Mother   . Diabetes Father   . Breast cancer Sister 31       bilateral breast cancer  . Hypertension Brother     Review of Systems  Exam:   LMP 06/28/2010     General appearance: alert, cooperative and appears stated age Head: Normocephalic, without obvious abnormality, atraumatic Neck: no adenopathy, supple, symmetrical, trachea midline and thyroid normal to inspection and palpation Lungs: clear to auscultation bilaterally Breasts: normal appearance, no masses or tenderness, No nipple retraction or dimpling, No nipple  discharge or bleeding, No axillary or supraclavicular adenopathy Heart: regular rate and rhythm Abdomen: soft, non-tender; no masses, no organomegaly Extremities: extremities normal, atraumatic, no cyanosis or edema Skin: Skin color, texture, turgor normal. No rashes or lesions Lymph nodes: Cervical, supraclavicular, and axillary nodes normal. No abnormal inguinal nodes palpated Neurologic: Grossly normal  Pelvic: External genitalia:  no lesions              Urethra:  normal appearing urethra with no masses, tenderness or lesions              Bartholins and Skenes: normal                 Vagina: normal appearing vagina with normal color and discharge, no lesions              Cervix: no lesions              Pap taken: {yes no:314532} Bimanual Exam:   Uterus:  normal size, contour, position, consistency, mobility, non-tender              Adnexa: no mass, fullness, tenderness              Rectal exam: {yes no:314532}.  Confirms.              Anus:  normal sphincter tone, no lesions  Chaperone was present for exam.  Assessment:   Well woman visit with normal exam.   Plan: Mammogram screening. Recommended self breast awareness. Pap and HR HPV as above. Guidelines for Calcium, Vitamin D, regular exercise program including cardiovascular and weight bearing exercise.   Follow up annually and prn.   Additional counseling given.  {yes [id number]. _______ minutes face to face time of which over 50% was spent in counseling.    After visit summary provided.

## 2018-05-27 ENCOUNTER — Ambulatory Visit: Payer: BLUE CROSS/BLUE SHIELD | Admitting: Obstetrics and Gynecology

## 2018-05-27 DIAGNOSIS — M810 Age-related osteoporosis without current pathological fracture: Secondary | ICD-10-CM | POA: Diagnosis not present

## 2018-06-17 ENCOUNTER — Ambulatory Visit (INDEPENDENT_AMBULATORY_CARE_PROVIDER_SITE_OTHER): Payer: BLUE CROSS/BLUE SHIELD | Admitting: Obstetrics and Gynecology

## 2018-06-17 ENCOUNTER — Encounter: Payer: Self-pay | Admitting: Obstetrics and Gynecology

## 2018-06-17 ENCOUNTER — Other Ambulatory Visit (HOSPITAL_COMMUNITY)
Admission: RE | Admit: 2018-06-17 | Discharge: 2018-06-17 | Disposition: A | Discharge status: Home / Self Care | Payer: BLUE CROSS/BLUE SHIELD | Source: Ambulatory Visit | Attending: Obstetrics and Gynecology | Admitting: Obstetrics and Gynecology

## 2018-06-17 ENCOUNTER — Other Ambulatory Visit: Payer: Self-pay

## 2018-06-17 VITALS — BP 128/68 | HR 66 | Resp 14 | Ht <= 58 in | Wt 114.0 lb

## 2018-06-17 DIAGNOSIS — Z01419 Encounter for gynecological examination (general) (routine) without abnormal findings: Secondary | ICD-10-CM | POA: Insufficient documentation

## 2018-06-17 NOTE — Patient Instructions (Signed)

## 2018-06-17 NOTE — Progress Notes (Signed)
62 y.o. G66P1001 Married [place] female here for annual exam.    Started Prolia this year through her PCP.  Stress incontinence, long standing.  Cannot exercise due to leakage.  Urinary frequency and night time urination.   Daughter married this year.   PCP:   Dr. Radene Gunning  Patient's last menstrual period was 06/28/2010.           Sexually active: No.  The current method of family planning is post menopausal status.    Exercising: Yes.    cardio, weights, walking Smoker:  no  Health Maintenance: Pap:  04-28-15 negative, HR HPV negative  History of abnormal Pap:  no MMG:  10-27-17 density C/BIRADS 2 benign  Colonoscopy:  03-13-10 diverticulosis, f/u 10 years  BMD:   02-09-18   Result  Osteoporosis.  PCP following.  TDaP:  07-28-08.  Will do with PCP in April 2020. HIV: long time ago Hep C: unsure.  Will do with PCP.  Screening Labs:  Hb today: PCP, Urine today: not collected    reports that she has never smoked. She has never used smokeless tobacco. She reports that she does not drink alcohol or use drugs.  Past Medical History:  Diagnosis Date  . Cancer [facility]) 2005   breast cancer, right  . Diabetes mellitus without complication ([place])   . Hypercholesteremia   . Hypertension   . Osteoporosis    and osteopenia  . Personal history of radiation therapy 2005   Right Breast Cancer  . Urinary incontinence     Past Surgical History:  Procedure Laterality Date  . BREAST LUMPECTOMY Right 2005  . BREAST SURGERY Right 2005   lumpectomy  . CESAREAN SECTION  1991    Current Outpatient Medications  Medication Sig Dispense Refill  . Cholecalciferol (VITAMIN D PO) Take 2,000 Int'l Units by mouth.    . Coenzyme Q10 (COQ10) 100 MG CAPS Take 100 mg by mouth daily.    . Denosumab (PROLIA Hansell) Inject into the skin.    . metFORMIN (GLUCOPHAGE) 500 MG tablet TAKE 1 TABLET BY MOUTH DAILY WITH LARGEST MEAL OF THE DAY  3  . ONGLYZA 5 MG TABS tablet Take 5 mg by mouth daily.  6  . pravastatin  (PRAVACHOL) 40 MG tablet Take 40 mg by mouth daily.     . ranitidine (ZANTAC) 150 MG tablet Take 150 mg by mouth as needed.     . TOPROL XL 100 MG 24 hr tablet Take 1 tablet by mouth daily.     No current facility-administered medications for this visit.     Family History  Problem Relation Age of Onset  . Heart disease Mother   . Diabetes Father   . Breast cancer Sister 77       bilateral breast cancer  . Hypertension Brother     Review of Systems  Constitutional: Negative.   HENT: Negative.   Eyes: Negative.   Respiratory: Negative.   Cardiovascular: Negative.   Gastrointestinal: Negative.   Endocrine: Negative.   Genitourinary: Positive for frequency.       Loss of urine with cough or sneeze Night urination    Musculoskeletal: Negative.   Skin: Negative.   Allergic/Immunologic: Negative.   Neurological: Negative.   Hematological: Negative.   Psychiatric/Behavioral: Negative.     Exam:   BP 128/68 (BP Location: Right Arm, Patient Position: Sitting, Cuff Size: Normal)   Pulse 66   Resp 14   Ht 4' 9.5" (1.461 m)   Wt 114  lb (51.7 kg)   LMP 06/28/2010   BMI 24.24 kg/m     General appearance: alert, cooperative and appears stated age Head: Normocephalic, without obvious abnormality, atraumatic Neck: no adenopathy, supple, symmetrical, trachea midline and thyroid normal to inspection and palpation Lungs: clear to auscultation bilaterally Breasts: left - normal appearance, no masses or tenderness, No nipple retraction or dimpling, No nipple discharge or bleeding, No axillary or supraclavicular adenopathy Right breast with scar, no masses or tenderness, No nipple retraction or dimpling, No nipple discharge or bleeding, No axillary or supraclavicular adenopathy Heart: regular rate and rhythm Abdomen: soft, non-tender; no masses, no organomegaly Extremities: extremities normal, atraumatic, no cyanosis or edema Skin: Skin color, texture, turgor normal. No rashes or  lesions Lymph nodes: Cervical, supraclavicular, and axillary nodes normal. No abnormal inguinal nodes palpated Neurologic: Grossly normal  Pelvic: External genitalia:  no lesions              Urethra:  normal appearing urethra with no masses, tenderness or lesions              Bartholins and Skenes: normal                 Vagina: normal appearing vagina with normal color and discharge, no lesions              Cervix: no lesions              Pap taken: Yes.   Bimanual Exam:  Uterus:  normal size, contour, position, consistency, mobility, non-tender              Adnexa: no mass, fullness, tenderness              Rectal exam: Yes.  .  Confirms.              Anus:  normal sphincter tone, no lesions  Chaperone was present for exam.  Assessment:   Well woman visit with normal exam. Hx right breast cancer.  Osteoporosis.  On Prolia.  GSI.  Mixed incontinence.   Plan: Mammogram screening. Recommended self breast awareness. Pap and HR HPV as above. Guidelines for Calcium, Vitamin D, regular exercise program including cardiovascular and weight bearing exercise. We discussed a pessary incontinence dish and Impressa.  She has declined surgical intervention.  Follow up annually and prn.   After visit summary provided.

## 2018-06-18 LAB — CYTOLOGY - PAP
Diagnosis: NEGATIVE
HPV: NOT DETECTED

## 2018-10-29 ENCOUNTER — Ambulatory Visit
Admission: RE | Admit: 2018-10-29 | Discharge: 2018-10-29 | Disposition: A | Discharge status: Home / Self Care | Payer: BLUE CROSS/BLUE SHIELD | Source: Ambulatory Visit | Attending: Internal Medicine | Admitting: Internal Medicine

## 2018-10-29 DIAGNOSIS — Z1231 Encounter for screening mammogram for malignant neoplasm of breast: Secondary | ICD-10-CM | POA: Diagnosis not present

## 2018-11-23 DIAGNOSIS — M810 Age-related osteoporosis without current pathological fracture: Secondary | ICD-10-CM | POA: Diagnosis not present

## 2018-11-26 DIAGNOSIS — M810 Age-related osteoporosis without current pathological fracture: Secondary | ICD-10-CM | POA: Diagnosis not present

## 2018-11-26 DIAGNOSIS — Z6823 Body mass index (BMI) 23.0-23.9, adult: Secondary | ICD-10-CM | POA: Diagnosis not present

## 2019-01-20 DIAGNOSIS — E119 Type 2 diabetes mellitus without complications: Secondary | ICD-10-CM | POA: Diagnosis not present

## 2019-01-20 DIAGNOSIS — E7849 Other hyperlipidemia: Secondary | ICD-10-CM | POA: Diagnosis not present

## 2019-01-20 DIAGNOSIS — Z0000 Encounter for general adult medical examination without abnormal findings: Secondary | ICD-10-CM | POA: Diagnosis not present

## 2019-01-20 DIAGNOSIS — M810 Age-related osteoporosis without current pathological fracture: Secondary | ICD-10-CM | POA: Diagnosis not present

## 2019-01-27 DIAGNOSIS — Z1331 Encounter for screening for depression: Secondary | ICD-10-CM | POA: Diagnosis not present

## 2019-01-27 DIAGNOSIS — E119 Type 2 diabetes mellitus without complications: Secondary | ICD-10-CM | POA: Diagnosis not present

## 2019-01-27 DIAGNOSIS — I10 Essential (primary) hypertension: Secondary | ICD-10-CM | POA: Diagnosis not present

## 2019-01-27 DIAGNOSIS — E785 Hyperlipidemia, unspecified: Secondary | ICD-10-CM | POA: Diagnosis not present

## 2019-01-27 DIAGNOSIS — Z0000 Encounter for general adult medical examination without abnormal findings: Secondary | ICD-10-CM | POA: Diagnosis not present

## 2019-01-27 DIAGNOSIS — K219 Gastro-esophageal reflux disease without esophagitis: Secondary | ICD-10-CM | POA: Diagnosis not present

## 2019-05-10 ENCOUNTER — Other Ambulatory Visit: Payer: Self-pay | Admitting: Internal Medicine

## 2019-05-14 DIAGNOSIS — H18413 Arcus senilis, bilateral: Secondary | ICD-10-CM | POA: Diagnosis not present

## 2019-05-14 DIAGNOSIS — H43393 Other vitreous opacities, bilateral: Secondary | ICD-10-CM | POA: Diagnosis not present

## 2019-05-14 DIAGNOSIS — E119 Type 2 diabetes mellitus without complications: Secondary | ICD-10-CM | POA: Diagnosis not present

## 2019-05-14 DIAGNOSIS — H2513 Age-related nuclear cataract, bilateral: Secondary | ICD-10-CM | POA: Diagnosis not present

## 2019-06-28 ENCOUNTER — Ambulatory Visit: Payer: BLUE CROSS/BLUE SHIELD | Admitting: Obstetrics and Gynecology

## 2019-07-08 ENCOUNTER — Other Ambulatory Visit: Payer: Self-pay | Admitting: Internal Medicine

## 2019-07-08 DIAGNOSIS — Z1231 Encounter for screening mammogram for malignant neoplasm of breast: Secondary | ICD-10-CM

## 2019-08-30 ENCOUNTER — Other Ambulatory Visit: Payer: Self-pay

## 2019-09-01 ENCOUNTER — Ambulatory Visit (INDEPENDENT_AMBULATORY_CARE_PROVIDER_SITE_OTHER): Payer: BC Managed Care – PPO | Admitting: Obstetrics and Gynecology

## 2019-09-01 ENCOUNTER — Telehealth: Payer: Self-pay | Admitting: Obstetrics and Gynecology

## 2019-09-01 ENCOUNTER — Encounter: Payer: Self-pay | Admitting: Obstetrics and Gynecology

## 2019-09-01 ENCOUNTER — Other Ambulatory Visit: Payer: Self-pay

## 2019-09-01 VITALS — BP 168/84 | HR 76 | Temp 97.6°F | Resp 20 | Ht <= 58 in | Wt 113.4 lb

## 2019-09-01 DIAGNOSIS — R2231 Localized swelling, mass and lump, right upper limb: Secondary | ICD-10-CM

## 2019-09-01 DIAGNOSIS — Z01419 Encounter for gynecological examination (general) (routine) without abnormal findings: Secondary | ICD-10-CM

## 2019-09-01 DIAGNOSIS — Z23 Encounter for immunization: Secondary | ICD-10-CM | POA: Diagnosis not present

## 2019-09-01 DIAGNOSIS — Z113 Encounter for screening for infections with a predominantly sexual mode of transmission: Secondary | ICD-10-CM | POA: Diagnosis not present

## 2019-09-01 NOTE — Telephone Encounter (Signed)
Please contact patient to schedule a dx right mammogram and right axillary [place] for a right axillary mass confirmed today on exam.   Patient has a hx of right breast cancer.   She does her imaging at the [facility].

## 2019-09-01 NOTE — Addendum Note (Signed)
Addended by: Lowella Fairy on: 09/01/2019 03:27 PM   Modules accepted: Orders

## 2019-09-01 NOTE — Telephone Encounter (Signed)
Order placed for right breast Dx MMG and right breast US, if needed.   Call placed to [facility], spoke with [facility]. Was advised their system is down, unable to schedule at this time.

## 2019-09-01 NOTE — Patient Instructions (Addendum)
EXERCISE AND DIET:  We recommended that you start or continue a regular exercise program for good health. Regular exercise means any activity that makes your heart beat faster and makes you sweat.  We recommend exercising at least 30 minutes per day at least 3 days a week, preferably 4 or 5.  We also recommend a diet low in fat and sugar.  Inactivity, poor dietary choices and obesity can cause diabetes, heart attack, stroke, and kidney damage, among others.    ALCOHOL AND SMOKING:  Women should limit their alcohol intake to no more than 7 drinks/beers/glasses of wine (combined, not each!) per week. Moderation of alcohol intake to this level decreases your risk of breast cancer and liver damage. And of course, no recreational drugs are part of a healthy lifestyle.  And absolutely no smoking or even second hand smoke. Most people know smoking can cause heart and lung diseases, but did you know it also contributes to weakening of your bones? Aging of your skin?  Yellowing of your teeth and nails?  CALCIUM AND VITAMIN D:  Adequate intake of calcium and Vitamin D are recommended.  The recommendations for exact amounts of these supplements seem to change often, but generally speaking 600 mg of calcium (either carbonate or citrate) and 800 units of Vitamin D per day seems prudent. Certain women may benefit from higher intake of Vitamin D.  If you are among these women, your doctor will have told you during your visit.    PAP SMEARS:  Pap smears, to check for cervical cancer or precancers,  have traditionally been done yearly, although recent scientific advances have shown that most women can have pap smears less often.  However, every woman still should have a physical exam from her gynecologist every year. It will include a breast check, inspection of the vulva and vagina to check for abnormal growths or skin changes, a visual exam of the cervix, and then an exam to evaluate the size and shape of the uterus and  ovaries.  And after 61 years of age, a rectal exam is indicated to check for rectal cancers. We will also provide age appropriate advice regarding health maintenance, like when you should have certain vaccines, screening for sexually transmitted diseases, bone density testing, colonoscopy, mammograms, etc.   MAMMOGRAMS:  All women over 40 years old should have a yearly mammogram. Many facilities now offer a "3D" mammogram, which may cost around $50 extra out of pocket. If possible,  we recommend you accept the option to have the 3D mammogram performed.  It both reduces the number of women who will be called back for extra views which then turn out to be normal, and it is better than the routine mammogram at detecting truly abnormal areas.    COLONOSCOPY:  Colonoscopy to screen for colon cancer is recommended for all women at age 50.  We know, you hate the idea of the prep.  We agree, BUT, having colon cancer and not knowing it is worse!!  Colon cancer so often starts as a polyp that can be seen and removed at colonscopy, which can quite literally save your life!  And if your first colonoscopy is normal and you have no family history of colon cancer, most women don't have to have it again for 10 years.  Once every ten years, you can do something that may end up saving your life, right?  We will be happy to help you get it scheduled when you are ready.    Be sure to check your insurance coverage so you understand how much it will cost.  It may be covered as a preventative service at no cost, but you should check your particular policy.     Raloxifene tablets (Evista) What is this medicine? RALOXIFENE (ral OX i feen) reduces the amount of calcium lost from bones. It is used to treat and prevent osteoporosis in women who have experienced menopause. It may also help prevent invasive breast cancer in certain women who have a high risk for breast cancer. This medicine may be used for other purposes; ask your health  care provider or pharmacist if you have questions. COMMON BRAND NAME(S): Evista What should I tell my health care provider before I take this medicine? They need to know if you have any of these conditions:  a history of blood clots  cancer  heart disease or recent heart attack  high levels of triglycerides (blood fat) in the blood  history of stroke  kidney disease  liver disease  premenopausal  smoke tobacco  an unusual or allergic reaction to raloxifene, other medicines, foods, dyes, or preservatives  pregnant or trying to get pregnant  breast-feeding How should I use this medicine? Take this medicine by mouth with a glass of water. Follow the directions on the prescription label. The tablets can be taken with or without food. Take your doses at regular intervals. Do not take your medicine more often than directed. A special MedGuide will be given to you by the pharmacist with each prescription and refill. Be sure to read this information carefully each time. Talk to your pediatrician regarding the use of this medicine in children. Special care may be needed. Overdosage: If you think you have taken too much of this medicine contact a poison control center or emergency room at once. NOTE: This medicine is only for you. Do not share this medicine with others. What if I miss a dose? If you miss a dose, take it as soon as you can. If it is almost time for your next dose, take only that dose. Do not take double or extra doses. What may interact with this medicine?  cholestyramine  female hormones, like estrogens  warfarin This list may not describe all possible interactions. Give your health care provider a list of all the medicines, herbs, non-prescription drugs, or dietary supplements you use. Also tell them if you smoke, drink alcohol, or use illegal drugs. Some items may interact with your medicine. What should I watch for while using this medicine? Visit your doctor or  health care professional for regular checks on your progress. Do not stop taking this medicine except on the advice of your doctor or health care professional. If you are taking this medicine to reduce your risk of getting breast cancer, you should know that this medicine does not prevent all types of breast cancer. Talk to your doctor if you have questions. This medicine does not prevent hot flashes. It may cause hot flashes in some patients at the start of therapy. You should make sure that you get enough calcium and vitamin D while you are taking this medicine. Discuss the foods you eat and the vitamins you take with your health care professional. Exercise may help to prevent bone loss. Discuss your exercise needs with your doctor or health care professional. This medicine can rarely cause blood clots. If you are going to have surgery, tell your doctor or health care professional that you are taking this medicine.  This medicine should be stopped at least 3 days before surgery. After surgery, it should be restarted only after you are walking again. It should not be restarted while you still need long periods of bed rest. You should not smoke while taking this medicine. Smoking may increase your risk of blood clots or stroke. If you have any reason to think you are pregnant; stop taking this medicine at once and contact your doctor or health care professional. Do not breast feed while taking this medicine. What side effects may I notice from receiving this medicine? Side effects that you should report to your doctor or health care professional as soon as possible:  allergic reactions like skin rash, itching or hives, swelling of the face, lips, or tongue)  breast tissue changes or discharge  signs and symptoms of a blood clot such as breathing problems; changes in vision; chest pain; severe, sudden headache; pain, swelling, warmth in the leg; trouble speaking; sudden numbness or weakness of the  face, arm or leg  signs and symptoms of a stroke like changes in vision; confusion; trouble speaking or understanding; severe headaches; sudden numbness or weakness of the face, arm or leg; trouble walking; dizziness; loss of balance or coordination  vaginal discharge that is bloody, brown, or rust Side effects that usually do not require medical attention (report to your doctor or health care professional if they continue or are bothersome):  hot flashes  joint pain  leg cramps  sweating  swelling of the ankles, feet, hands This list may not describe all possible side effects. Call your doctor for medical advice about side effects. You may report side effects to FDA at 1-800-FDA-1088. Where should I keep my medicine? Keep out of the reach of children. Store at room temperature between 15 and 30 degrees C (59 and 86 degrees F). Throw away any unused medicine after the expiration date. NOTE: This sheet is a summary. It may not cover all possible information. If you have questions about this medicine, talk to your doctor, pharmacist, or health care provider.  2020 Elsevier/Gold Standard (2016-11-20 17:15:34)  Kegel Exercises  Kegel exercises can help strengthen your pelvic floor muscles. The pelvic floor is a group of muscles that support your rectum, small intestine, and bladder. In females, pelvic floor muscles also help support the womb (uterus). These muscles help you control the flow of urine and stool. Kegel exercises are painless and simple, and they do not require any equipment. Your provider may suggest Kegel exercises to:  Improve bladder and bowel control.  Improve sexual response.  Improve weak pelvic floor muscles after surgery to remove the uterus (hysterectomy) or pregnancy (females).  Improve weak pelvic floor muscles after prostate gland removal or surgery (males). Kegel exercises involve squeezing your pelvic floor muscles, which are the same muscles you squeeze  when you try to stop the flow of urine or keep from passing gas. The exercises can be done while sitting, standing, or lying down, but it is best to vary your position. Exercises How to do Kegel exercises: 1. Squeeze your pelvic floor muscles tight. You should feel a tight lift in your rectal area. If you are a female, you should also feel a tightness in your vaginal area. Keep your stomach, buttocks, and legs relaxed. 2. Hold the muscles tight for up to 10 seconds. 3. Breathe normally. 4. Relax your muscles. 5. Repeat as told by your health care provider. Repeat this exercise daily as told by your health care  provider. Continue to do this exercise for at least 4-6 weeks, or for as long as told by your health care provider. You may be referred to a physical therapist who can help you learn more about how to do Kegel exercises. Depending on your condition, your health care provider may recommend:  Varying how long you squeeze your muscles.  Doing several sets of exercises every day.  Doing exercises for several weeks.  Making Kegel exercises a part of your regular exercise routine. This information is not intended to replace advice given to you by your health care provider. Make sure you discuss any questions you have with your health care provider. Document Released: 09/30/2012 Document Revised: 06/03/2018 Document Reviewed: 06/03/2018 Elsevier Patient Education  2020 [employer].

## 2019-09-01 NOTE — Progress Notes (Signed)
63 y.o. G30P1001 Married [place] female here for annual exam.   Stopped Prolia--can't afford.  Fosamax caused bone pain.   Patient complaining of "lump" under right arm for less than 6 months.  No pain but can have a pulling feeling.  PCP:  Berneta Sages, MD   Patient's last menstrual period was 06/28/2010.           Sexually active: No.  The current method of family planning is abstinence/Postmenopausal.    Exercising: No.  The patient does not participate in regular exercise at present.  Walks 2-3x/week Smoker:  no  Health Maintenance: Pap:06-17-18 Neg:Neg HR HPV, 04-28-15 Neg:Neg HR HPV, 02-07-11 Neg History of abnormal Pap:  Hx of condyloma MMG: 10-29-18 3D/Neg/density B/BiRads1-- appt. 11-01-19 Colonoscopy:  03/13/10 Diverticulosis - f/u 10 years  BMD: 02-09-18  Result: Osteoporosis of hip and spine. PCP following TDaP: [facility]--pt. Would like one today Gardasil:   n/a HIV: Neg years ago Hep C:no Screening Labs: PCP.   reports that she has never smoked. She has never used smokeless tobacco. She reports that she does not drink alcohol or use drugs.  Past Medical History:  Diagnosis Date  . Cancer [facility]) 2005   breast cancer, right  . Diabetes mellitus without complication ([place])   . Hypercholesteremia   . Hypertension   . Osteoporosis    and osteopenia  . Personal history of radiation therapy 2005   Right Breast Cancer  . Urinary incontinence     Past Surgical History:  Procedure Laterality Date  . BREAST LUMPECTOMY Right 2005  . BREAST SURGERY Right 2005   lumpectomy  . CESAREAN SECTION  1991    Current Outpatient Medications  Medication Sig Dispense Refill  . aspirin EC 81 MG tablet Take 81 mg by mouth daily.    . Cholecalciferol (VITAMIN D PO) Take 2,000 Int'l Units by mouth.    . Coenzyme Q10 (COQ10) 100 MG CAPS Take 100 mg by mouth daily.    . famotidine (PEPCID) 10 MG tablet Take 10 mg by mouth as needed for heartburn or indigestion.    . Magnesium 250 MG TABS Take 1  tablet by mouth daily.    . metFORMIN (GLUCOPHAGE) 500 MG tablet TAKE 1 TABLET BY MOUTH DAILY WITH LARGEST MEAL OF THE DAY  3  . pravastatin (PRAVACHOL) 40 MG tablet Take 40 mg by mouth daily.     . TOPROL XL 100 MG 24 hr tablet Take 1 tablet by mouth daily.     No current facility-administered medications for this visit.     Family History  Problem Relation Age of Onset  . Heart disease Mother   . Diabetes Father   . Breast cancer Sister 81       bilateral breast cancer  . Cancer Sister   . Hypertension Brother     Review of Systems  All other systems reviewed and are negative.   Exam:   BP (!) 168/84   Pulse 76   Temp 97.6 F (36.4 C) (Temporal)   Resp 20   Ht 4\' 9"  (1.448 m)   Wt 113 lb 6.4 oz (51.4 kg)   LMP 06/28/2010   BMI 24.54 kg/m     General appearance: alert, cooperative and appears stated age Head: normocephalic, without obvious abnormality, atraumatic Neck: no adenopathy, supple, symmetrical, trachea midline and thyroid normal to inspection and palpation Lungs: clear to auscultation bilaterally Breasts: left - normal appearance, no masses or tenderness, No nipple retraction or dimpling, No nipple  discharge or bleeding, No axillary adenopathy Right - normal appearance, no masses or tenderness, No nipple retraction or dimpling, No nipple discharge or bleeding, right axillary mass, soft, 2 cm.  Heart: regular rate and rhythm Abdomen: soft, non-tender; no masses, no organomegaly Extremities: extremities normal, atraumatic, no cyanosis or edema Skin: skin color, texture, turgor normal. No rashes or lesions Lymph nodes: cervical, supraclavicular, and axillary nodes normal. Neurologic: grossly normal  Pelvic: External genitalia:  no lesions              No abnormal inguinal nodes palpated.              Urethra:  normal appearing urethra with no masses, tenderness or lesions              Bartholins and Skenes: normal                 Vagina: normal appearing  vagina with normal color and discharge, no lesions              Cervix: no lesions              Pap taken: No. Bimanual Exam:  Uterus:  normal size, contour, position, consistency, mobility, non-tender              Adnexa: no mass, fullness, tenderness              Good Kegel.               Rectal exam: Yes.  .  Confirms.              Anus:  normal sphincter tone, no lesions  Chaperone was present for exam.  Assessment:   Well woman visit with normal exam. Remote hx of condyloma.  Hx right breast cancer.  Right axillary mass.  Osteoporosis.  Off Prolia.  GSI.  Mixed incontinence.  Elevated BP today.  STD screening  Plan: Mammogram dx right and right axillary [place].   We will call to schedule.  Self breast awareness reviewed. Pap and HR HPV in 2024.  Guidelines for Calcium, Vitamin D, regular exercise program including cardiovascular and weight bearing exercise. HIV and Hep C screening.  TDap.  Kegel's. Monitor BP and contact PCP if continues to be elevated. Follow up annually and prn.   After visit summary provided.

## 2019-09-02 ENCOUNTER — Other Ambulatory Visit: Payer: Self-pay | Admitting: Obstetrics and Gynecology

## 2019-09-02 LAB — HEPATITIS C ANTIBODY: Hep C Virus Ab: <0.1 s/co ratio (ref 0.0–0.9)

## 2019-09-02 LAB — HIV ANTIBODY (ROUTINE TESTING W REFLEX): HIV Screen 4th Generation wRfx: NONREACTIVE

## 2019-09-02 NOTE — Telephone Encounter (Signed)
Spoke with Andrea [name] at [facility].   Confirmed orders placed for Right breast US to include axilla. No additional orders needed for right axilla mass.   Routing to Dr. Quincy Simmonds.

## 2019-09-02 NOTE — Telephone Encounter (Signed)
Patient needs a right axillary ultrasound which is where the mass is located.

## 2019-09-02 NOTE — Telephone Encounter (Signed)
Spoke with Lilia Pro at [facility]. Right breast Dx MMG and [place], if needed, scheduled for 09/09/19 at 7:40am, arrive at 7:20am.    Call to patient to notify of appt, left detailed message, ok per dpr. Advised of appt as seen above, contact TBC if any changes need to be made to appt, return call to office if any additional questions.   Routing to provider for final review. Patient is agreeable to disposition. Will close encounter.

## 2019-09-09 ENCOUNTER — Ambulatory Visit
Admission: RE | Admit: 2019-09-09 | Discharge: 2019-09-09 | Disposition: A | Discharge status: Home / Self Care | Payer: BLUE CROSS/BLUE SHIELD | Source: Ambulatory Visit | Attending: Obstetrics and Gynecology | Admitting: Obstetrics and Gynecology

## 2019-09-09 ENCOUNTER — Other Ambulatory Visit: Payer: Self-pay

## 2019-09-09 ENCOUNTER — Other Ambulatory Visit: Payer: Self-pay | Admitting: Obstetrics and Gynecology

## 2019-09-09 DIAGNOSIS — R2231 Localized swelling, mass and lump, right upper limb: Secondary | ICD-10-CM

## 2019-09-09 DIAGNOSIS — R928 Other abnormal and inconclusive findings on diagnostic imaging of breast: Secondary | ICD-10-CM | POA: Diagnosis not present

## 2019-09-09 DIAGNOSIS — N6489 Other specified disorders of breast: Secondary | ICD-10-CM | POA: Diagnosis not present

## 2019-09-15 ENCOUNTER — Telehealth: Payer: Self-pay | Admitting: *Deleted

## 2019-09-15 NOTE — Telephone Encounter (Signed)
-----   Message from Nunzio Cobbs, MD sent at 09/15/2019  1:05 PM EST ----- Please contact patient in follow up to her office exam and normal right breast imaging.  I would like her to have a breast recheck with me in the next 2 - 4 weeks.  She had a fullness in the right axilla and has a history of right breast cancer.

## 2019-09-15 NOTE — Telephone Encounter (Signed)
Notes recorded by Burnice Logan, RN on 09/15/2019 at 3:10 PM EST  Left message to call Sharee Pimple, RN at [facility].

## 2019-09-21 NOTE — Telephone Encounter (Signed)
Please reach out to patient again to schedule a breast recheck with me.

## 2019-09-21 NOTE — Telephone Encounter (Signed)
Detailed message left per DPR and patient request to have patient return call to schedule breast recheck.

## 2019-09-21 NOTE — Telephone Encounter (Signed)
Patient returned call. Message given to patient as seen below from Dr. Quincy Simmonds. Patient requesting to schedule closer to the 4 week mark. Breast recheck scheduled for 10-19-2019 at 1300. Patient agreeable to date and time of appointment.   Routing to provider and will close encounter.

## 2019-09-21 NOTE — Telephone Encounter (Signed)
Patient returning call to [place]. States OK to leave detailed message on voicemail at [phone].

## 2019-09-21 NOTE — Telephone Encounter (Signed)
Left message to call Artemis Koller, RN at [facility] [phone].   

## 2019-09-30 ENCOUNTER — Other Ambulatory Visit: Payer: Self-pay

## 2019-09-30 DIAGNOSIS — Z20822 Contact with and (suspected) exposure to covid-19: Secondary | ICD-10-CM

## 2019-10-03 LAB — NOVEL CORONAVIRUS, NAA: SARS-CoV-2, NAA: NOT DETECTED

## 2019-10-08 DIAGNOSIS — R0789 Other chest pain: Secondary | ICD-10-CM | POA: Diagnosis not present

## 2019-10-08 DIAGNOSIS — E785 Hyperlipidemia, unspecified: Secondary | ICD-10-CM | POA: Diagnosis not present

## 2019-10-08 DIAGNOSIS — K219 Gastro-esophageal reflux disease without esophagitis: Secondary | ICD-10-CM | POA: Diagnosis not present

## 2019-10-08 DIAGNOSIS — I10 Essential (primary) hypertension: Secondary | ICD-10-CM | POA: Diagnosis not present

## 2019-10-08 DIAGNOSIS — E119 Type 2 diabetes mellitus without complications: Secondary | ICD-10-CM | POA: Diagnosis not present

## 2019-10-12 ENCOUNTER — Telehealth: Payer: Self-pay | Admitting: Obstetrics and Gynecology

## 2019-10-12 NOTE — Telephone Encounter (Signed)
I do recommend a follow up physical examination.  Patient presented with a lump under her right arm, confirmed on exam, and she has a history of right breast cancer.  Her dx imaging was normal.  There is a discrepancy between her exam and the imaging.

## 2019-10-12 NOTE — Telephone Encounter (Signed)
Patient cancelled the 4 week breast recheck because of her work schedule. She will call back to reschedule.

## 2019-10-14 NOTE — Telephone Encounter (Signed)
Call placed to patient, left detailed message, ok per dpr. Advised per Dr. Quincy Simmonds, return call to office to schedule OV.

## 2019-10-15 NOTE — Telephone Encounter (Signed)
Spoke with patient. OV scheduled for 11/01/19 at 8am with Dr. Quincy Simmonds.   Routing to provider for final review. Patient is agreeable to disposition. Will close encounter.

## 2019-10-19 ENCOUNTER — Ambulatory Visit: Payer: BC Managed Care – PPO | Admitting: Obstetrics and Gynecology

## 2019-10-28 NOTE — Progress Notes (Signed)
GYNECOLOGY  VISIT   HPI: 63 y.o.   Married  Asian  female   G1P1001 with Patient's last menstrual period was 06/28/2010.here for follow up breast exam.    Patient seen for her routine exam on 09/01/19 and noted a lump under her right arm for less than 6 months.  She had a pulling sensation. On physical exam she had a 2 cm mass noted.  She a a normal dx mammogram and US of the axilla.   Asking about the Covid vaccine.   GYNECOLOGIC HISTORY: Patient's last menstrual period was 06/28/2010. Contraception:  Abstinence/Postmenopausal Menopausal hormone therapy: none Last mammogram:09-09-19 Diag.Rt.Br./Neg/density B/BiRads2,10-29-18 3D/Neg/density B/BiRads1-- appt. 11-01-19 Last pap smear: 06-17-18 Neg:Neg HR HPV, 04-28-15 Neg:Neg HR HPV, 02-07-11 Neg        OB History    Gravida  1   Para  1   Term  1   Preterm      AB      Living  1     SAB      TAB      Ectopic      Multiple      Live Births                 Patient Active Problem List   Diagnosis Date Noted  . History of breast cancer 06/24/2014  . Malignant neoplasm of breast (female), unspecified site 06/24/2014  . Genuine stress incontinence, female 04/18/2014  . Overactive bladder 04/18/2014    Past Medical History:  Diagnosis Date  . Cancer [facility]) 2005   breast cancer, right  . Diabetes mellitus without complication ([place])   . Hypercholesteremia   . Hypertension   . Osteoporosis    and osteopenia  . Personal history of radiation therapy 2005   Right Breast Cancer  . Urinary incontinence     Past Surgical History:  Procedure Laterality Date  . BREAST LUMPECTOMY Right 2005  . BREAST SURGERY Right 2005   lumpectomy  . CESAREAN SECTION  1991    Current Outpatient Medications  Medication Sig Dispense Refill  . amLODipine (NORVASC) 5 MG tablet Take 5 mg by mouth daily.    Marland Kitchen aspirin EC 81 MG tablet Take 81 mg by mouth daily.    . Cholecalciferol (VITAMIN D PO) Take 2,000 Int'l Units by mouth.    .  Coenzyme Q10 (COQ10) 100 MG CAPS Take 100 mg by mouth daily.    . famotidine (PEPCID) 10 MG tablet Take 10 mg by mouth as needed for heartburn or indigestion.    . Magnesium 250 MG TABS Take 1 tablet by mouth daily.    . metFORMIN (GLUCOPHAGE) 500 MG tablet TAKE 1 TABLET BY MOUTH DAILY WITH LARGEST MEAL OF THE DAY  3  . pantoprazole (PROTONIX) 40 MG tablet Take 40 mg by mouth daily.    . pravastatin (PRAVACHOL) 40 MG tablet Take 40 mg by mouth daily.     . TOPROL XL 100 MG 24 hr tablet Take 1 tablet by mouth daily.     No current facility-administered medications for this visit.     ALLERGIES: Fosamax [alendronate] and Telmisartan  Family History  Problem Relation Age of Onset  . Heart disease Mother   . Diabetes Father   . Breast cancer Sister 65       bilateral breast cancer  . Cancer Sister   . Hypertension Brother     Social History   Socioeconomic History  . Marital status: Married  Spouse name: Not on file  . Number of children: Not on file  . Years of education: Not on file  . Highest education level: Not on file  Occupational History  . Not on file  Tobacco Use  . Smoking status: Never Smoker  . Smokeless tobacco: Never Used  Substance and Sexual Activity  . Alcohol use: No    Alcohol/week: 0.0 standard drinks  . Drug use: No  . Sexual activity: Never    Partners: Male    Birth control/protection: Post-menopausal, Abstinence  Other Topics Concern  . Not on file  Social History Narrative  . Not on file   Social Determinants of Health   Financial Resource Strain:   . Difficulty of Paying Living Expenses: Not on file  Food Insecurity:   . Worried About Charity fundraiser in the Last Year: Not on file  . Ran Out of Food in the Last Year: Not on file  Transportation Needs:   . Lack of Transportation (Medical): Not on file  . Lack of Transportation (Non-Medical): Not on file  Physical Activity:   . Days of Exercise per Week: Not on file  . Minutes of  Exercise per Session: Not on file  Stress:   . Feeling of Stress : Not on file  Social Connections:   . Frequency of Communication with Friends and Family: Not on file  . Frequency of Social Gatherings with Friends and Family: Not on file  . Attends Religious Services: Not on file  . Active Member of Clubs or Organizations: Not on file  . Attends Archivist Meetings: Not on file  . Marital Status: Not on file  Intimate Partner Violence:   . Fear of Current or Ex-Partner: Not on file  . Emotionally Abused: Not on file  . Physically Abused: Not on file  . Sexually Abused: Not on file    Review of Systems  All other systems reviewed and are negative.   PHYSICAL EXAMINATION:    BP (!) 146/76   Pulse 70   Temp (!) 96.8 F (36 C) (Temporal)   Ht 4\' 9"  (1.448 m)   Wt 116 lb (52.6 kg)   LMP 06/28/2010   BMI 25.10 kg/m     General appearance: alert, cooperative and appears stated age   Breasts: left - normal appearance, no masses or tenderness, No nipple retraction or dimpling, No nipple discharge or bleeding, No axillary or supraclavicular adenopathy  Right - scar noted,  No masses or tenderness.    No nipple retraction or dimpling, No nipple discharge or bleeding, No supraclavicular adenopathy.  2 cm soft, nontender axillary mass.   Chaperone, Marisa Sprinkles, was present for exam.  ASSESSMENT  Right axillary mass.  Hx right breast cancer.   Negative dx imaging.  Health care education.   PLAN  We discussed axillary masses and potential etiologies - lipoma, node, benign tumor, or cancer.  Will refer to general surgery.  Covid vaccine discussed.    An After Visit Summary was printed and given to the patient.  ___20___ minutes face to face time of which over 50% was spent in counseling.

## 2019-11-01 ENCOUNTER — Other Ambulatory Visit: Payer: Self-pay

## 2019-11-01 ENCOUNTER — Ambulatory Visit
Admission: RE | Admit: 2019-11-01 | Discharge: 2019-11-01 | Disposition: A | Discharge status: Home / Self Care | Payer: BLUE CROSS/BLUE SHIELD | Source: Ambulatory Visit | Attending: Internal Medicine | Admitting: Internal Medicine

## 2019-11-01 ENCOUNTER — Ambulatory Visit: Payer: BLUE CROSS/BLUE SHIELD

## 2019-11-01 ENCOUNTER — Encounter: Payer: Self-pay | Admitting: Obstetrics and Gynecology

## 2019-11-01 ENCOUNTER — Ambulatory Visit (INDEPENDENT_AMBULATORY_CARE_PROVIDER_SITE_OTHER): Payer: BC Managed Care – PPO | Admitting: Obstetrics and Gynecology

## 2019-11-01 VITALS — BP 146/76 | HR 70 | Temp 96.8°F | Ht <= 58 in | Wt 116.0 lb

## 2019-11-01 DIAGNOSIS — Z719 Counseling, unspecified: Secondary | ICD-10-CM

## 2019-11-01 DIAGNOSIS — R2231 Localized swelling, mass and lump, right upper limb: Secondary | ICD-10-CM

## 2019-11-01 DIAGNOSIS — Z1231 Encounter for screening mammogram for malignant neoplasm of breast: Secondary | ICD-10-CM

## 2019-11-03 ENCOUNTER — Ambulatory Visit: Payer: BC Managed Care – PPO | Admitting: Obstetrics and Gynecology

## 2019-11-11 ENCOUNTER — Encounter: Payer: Self-pay | Admitting: Obstetrics and Gynecology

## 2019-11-11 ENCOUNTER — Telehealth: Payer: Self-pay | Admitting: Obstetrics and Gynecology

## 2019-11-11 NOTE — Telephone Encounter (Signed)
Patient sent the following message through [place]. Routing to triage to assist patient with request.  [name], Andrea Capitano "[name]"  P [facility]  Phone Number: [phone]  I saw Dr Quincy Simmonds 1/4,she sent a referral to Dr Donne Hazel office to set a consult appt,I have not heard from their office,should I call their office myself or give them more time.Thank you

## 2019-11-12 NOTE — Telephone Encounter (Signed)
Call placed to give update on referral to Dr. Donne Hazel. Per Judson Roch they have received the referral and will be calling today to get the patient scheduled.

## 2019-11-16 NOTE — Telephone Encounter (Signed)
Call placed to give appointment details for [facility]. Appointment scheduled 12/16/19@3 :40pm with Dr. Donne Hazel.

## 2019-12-17 ENCOUNTER — Ambulatory Visit: Payer: BC Managed Care – PPO | Attending: Internal Medicine

## 2019-12-17 DIAGNOSIS — Z23 Encounter for immunization: Secondary | ICD-10-CM | POA: Insufficient documentation

## 2019-12-17 NOTE — Progress Notes (Signed)
   Covid-19 Vaccination Clinic  Name:  Andrea [name]    MRN: [id number] DOB: 09/04/1956  12/17/2019  Andrea [name] was observed post Covid-19 immunization for 15 minutes without incidence. She was provided with Vaccine Information Sheet and instruction to access the V-Safe system.   Andrea [name] was instructed to call 911 with any severe reactions post vaccine: Marland Kitchen Difficulty breathing  . Swelling of your face and throat  . A fast heartbeat  . A bad rash all over your body  . Dizziness and weakness    Immunizations Administered    Name Date Dose VIS Date Route   Pfizer COVID-19 Vaccine 12/17/2019  1:37 PM 0.3 mL 10/08/2019 Intramuscular   Manufacturer: [place]   Lot: [id number]   [place]: [id number]

## 2020-01-07 DIAGNOSIS — M7989 Other specified soft tissue disorders: Secondary | ICD-10-CM | POA: Diagnosis not present

## 2020-01-11 ENCOUNTER — Ambulatory Visit: Payer: BC Managed Care – PPO | Attending: Internal Medicine

## 2020-01-11 DIAGNOSIS — Z23 Encounter for immunization: Secondary | ICD-10-CM

## 2020-01-11 NOTE — Progress Notes (Signed)
   Covid-19 Vaccination Clinic  Name:  Andrea [name]    MRN: [id number] DOB: May 25, 1956  01/11/2020  [name] was observed post Covid-19 immunization for 15 minutes without incident. She was provided with Vaccine Information Sheet and instruction to access the V-Safe system.   [name] was instructed to call 911 with any severe reactions post vaccine: Marland Kitchen Difficulty breathing  . Swelling of face and throat  . A fast heartbeat  . A bad rash all over body  . Dizziness and weakness   Immunizations Administered    Name Date Dose VIS Date Route   Pfizer COVID-19 Vaccine 01/11/2020  8:36 AM 0.3 mL 10/08/2019 Intramuscular   Manufacturer: [place]   Lot: [id number]   [place]: [id number]

## 2020-02-10 DIAGNOSIS — Z0000 Encounter for general adult medical examination without abnormal findings: Secondary | ICD-10-CM | POA: Diagnosis not present

## 2020-02-10 DIAGNOSIS — E119 Type 2 diabetes mellitus without complications: Secondary | ICD-10-CM | POA: Diagnosis not present

## 2020-02-10 DIAGNOSIS — M810 Age-related osteoporosis without current pathological fracture: Secondary | ICD-10-CM | POA: Diagnosis not present

## 2020-02-10 DIAGNOSIS — E7849 Other hyperlipidemia: Secondary | ICD-10-CM | POA: Diagnosis not present

## 2020-02-17 DIAGNOSIS — E785 Hyperlipidemia, unspecified: Secondary | ICD-10-CM | POA: Diagnosis not present

## 2020-02-17 DIAGNOSIS — Z1331 Encounter for screening for depression: Secondary | ICD-10-CM | POA: Diagnosis not present

## 2020-02-17 DIAGNOSIS — E119 Type 2 diabetes mellitus without complications: Secondary | ICD-10-CM | POA: Diagnosis not present

## 2020-02-17 DIAGNOSIS — Z0000 Encounter for general adult medical examination without abnormal findings: Secondary | ICD-10-CM | POA: Diagnosis not present

## 2020-02-17 DIAGNOSIS — I10 Essential (primary) hypertension: Secondary | ICD-10-CM | POA: Diagnosis not present

## 2020-02-17 DIAGNOSIS — Z1212 Encounter for screening for malignant neoplasm of rectum: Secondary | ICD-10-CM | POA: Diagnosis not present

## 2020-02-17 DIAGNOSIS — R82998 Other abnormal findings in urine: Secondary | ICD-10-CM | POA: Diagnosis not present

## 2020-02-17 DIAGNOSIS — K219 Gastro-esophageal reflux disease without esophagitis: Secondary | ICD-10-CM | POA: Diagnosis not present

## 2020-02-22 ENCOUNTER — Other Ambulatory Visit: Payer: Self-pay | Admitting: Internal Medicine

## 2020-02-22 DIAGNOSIS — M810 Age-related osteoporosis without current pathological fracture: Secondary | ICD-10-CM

## 2020-03-24 ENCOUNTER — Other Ambulatory Visit: Payer: BC Managed Care – PPO

## 2020-04-23 DIAGNOSIS — Z1212 Encounter for screening for malignant neoplasm of rectum: Secondary | ICD-10-CM | POA: Diagnosis not present

## 2020-04-23 DIAGNOSIS — Z1211 Encounter for screening for malignant neoplasm of colon: Secondary | ICD-10-CM | POA: Diagnosis not present

## 2020-05-05 ENCOUNTER — Ambulatory Visit
Admission: RE | Admit: 2020-05-05 | Discharge: 2020-05-05 | Disposition: A | Discharge status: Home / Self Care | Payer: BC Managed Care – PPO | Source: Ambulatory Visit | Attending: Internal Medicine | Admitting: Internal Medicine

## 2020-05-05 ENCOUNTER — Other Ambulatory Visit: Payer: Self-pay

## 2020-05-05 DIAGNOSIS — M810 Age-related osteoporosis without current pathological fracture: Secondary | ICD-10-CM | POA: Diagnosis not present

## 2020-05-05 DIAGNOSIS — Z780 Asymptomatic menopausal state: Secondary | ICD-10-CM | POA: Diagnosis not present

## 2020-05-08 ENCOUNTER — Encounter: Payer: Self-pay | Admitting: Obstetrics and Gynecology

## 2020-05-09 ENCOUNTER — Telehealth: Payer: Self-pay | Admitting: *Deleted

## 2020-05-09 NOTE — Telephone Encounter (Signed)
Burnice Logan, RN  05/09/2020 9:11 AM EDT Back to Top    Left message to call Sharee Pimple, RN at [facility].

## 2020-05-09 NOTE — Telephone Encounter (Signed)
-----   Message from Nunzio Cobbs, MD sent at 05/08/2020  8:11 PM EDT ----- Please contact patient about her bone density report ordered by her PCP.  It shows severe osteoporosis of the spine.  I recommend she follow up with Dr. Dagmar Hait.

## 2020-05-10 NOTE — Telephone Encounter (Signed)
Spoke with pt. Pt given results and recommendations per Dr Quincy Simmonds. Pt agreeable and verbalized understanding and will call and make appt with Dr Dagmar Hait.   Routing to Dr Quincy Simmonds for review. Encounter closed.

## 2020-05-10 NOTE — Telephone Encounter (Signed)
Patient is returning call.  °

## 2020-09-18 ENCOUNTER — Other Ambulatory Visit: Payer: Self-pay | Admitting: Internal Medicine

## 2020-09-18 DIAGNOSIS — Z1231 Encounter for screening mammogram for malignant neoplasm of breast: Secondary | ICD-10-CM

## 2020-10-10 NOTE — Progress Notes (Signed)
64 y.o. G23P1001 Divorced Asian female here for annual exam.    No vaginal bleeding or spotting.  Some vaginal itching.  No discharge.   A1C is around 6.5.  Still with some incontinence.   A lot of turnover at work.     Received Covid vaccine.  PCP:  Berneta Sages, MD   Patient's last menstrual period was 06/28/2010.           Sexually active: No.  The current method of family planning is post menopausal status.    Exercising: Yes.    walking Smoker:  no  Health Maintenance: Pap::06-17-18 Neg:Neg HR HPV, 04-28-15 Neg:Neg HR HPV, 02-07-11 Neg  History of abnormal Pap:  Hx of condyloma MMG: 11-01-19 3D/Neg/density B/BiRads1 Colonoscopy: 2021 Neg Cologuard. BMD: 05-05-20 Result :severe osteoporosis of spine--sees PCP--refused treatment.  Aching with Fosamax.  Prolia expensive.  TDaP: 09-01-19 Gardasil:   no HIV: 09-01-19 NR Hep C: 09-01-19 Neg Screening Labs:  PCP in May.    reports that she has never smoked. She has never used smokeless tobacco. She reports that she does not drink alcohol and does not use drugs.  Past Medical History:  Diagnosis Date   Cancer [facility]) 2005   breast cancer, right   Diabetes mellitus without complication ([place])    Hypercholesteremia    Hypertension    Osteoporosis    Personal history of radiation therapy 2005   Right Breast Cancer   Urinary incontinence     Past Surgical History:  Procedure Laterality Date   BREAST LUMPECTOMY Right 2005   BREAST SURGERY Right 2005   lumpectomy   CESAREAN SECTION  1991    Current Outpatient Medications  Medication Sig Dispense Refill   amLODipine (NORVASC) 10 MG tablet Take 10 mg by mouth daily.     Cholecalciferol (VITAMIN D PO) Take 2,000 Int'l Units by mouth.     metFORMIN (GLUCOPHAGE) 500 MG tablet TAKE 1 TABLET BY MOUTH DAILY WITH LARGEST MEAL OF THE DAY  3   NON FORMULARY CBD gummies Takes prn for sleep     pantoprazole (PROTONIX) 40 MG tablet Take 40 mg by mouth daily.     TOPROL XL  100 MG 24 hr tablet Take 1 tablet by mouth daily.     famotidine (PEPCID) 10 MG tablet Take 10 mg by mouth as needed for heartburn or indigestion.     No current facility-administered medications for this visit.    Family History  Problem Relation Age of Onset   Heart disease Mother    Diabetes Father    Breast cancer Sister 74       bilateral breast cancer   Cancer Sister    Hypertension Brother     Review of Systems  All other systems reviewed and are negative.   Exam:   BP (!) 142/70    Pulse 75    Ht 4' 9.5" (1.461 m)    Wt 112 lb (50.8 kg)    LMP 06/28/2010    SpO2 98%    BMI 23.82 kg/m     General appearance: alert, cooperative and appears stated age Head: normocephalic, without obvious abnormality, atraumatic Neck: no adenopathy, supple, symmetrical, trachea midline and thyroid normal to inspection and palpation Lungs: clear to auscultation bilaterally Breasts: normal appearance, no masses or tenderness, No nipple retraction or dimpling, No nipple discharge or bleeding, No axillary adenopathy Heart: regular rate and rhythm Abdomen: soft, non-tender; no masses, no organomegaly Extremities: extremities normal, atraumatic, no cyanosis or edema Skin:  skin color, texture, turgor normal. No rashes or lesions Lymph nodes: cervical, supraclavicular, and axillary nodes normal. Neurologic: grossly normal  Pelvic: External genitalia:  no lesions              No abnormal inguinal nodes palpated.              Urethra:  normal appearing urethra with no masses, tenderness or lesions              Bartholins and Skenes: normal                 Vagina: normal appearing vagina with normal color and discharge, no lesions              Cervix: no lesions              Pap taken: No. Bimanual Exam:  Uterus:  normal size, contour, position, consistency, mobility, non-tender              Adnexa: no mass, fullness, tenderness              Rectal exam: Yes.  .  Confirms.               Anus:  normal sphincter tone, no lesions  Chaperone was present for exam.  Assessment:   Well woman visit with normal exam. Remote hx of condyloma.  Hx right breast cancer.  Osteoporosis. Off Prolia. Mixed incontinence.  Plan: Mammogram screening discussed. Self breast awareness reviewed. Pap and HR HPV 2024.  Guidelines for Calcium, Vitamin D, regular exercise program including cardiovascular and weight bearing exercise. We discussed possible Evista versus Forteo for treatment of her osteoporosis.  She will discuss with her PCP. We reviewed Impressa for treatment of urinary incontinence.  Follow up annually and prn.

## 2020-10-11 ENCOUNTER — Encounter: Payer: Self-pay | Admitting: Obstetrics and Gynecology

## 2020-10-11 ENCOUNTER — Other Ambulatory Visit: Payer: Self-pay

## 2020-10-11 ENCOUNTER — Ambulatory Visit (INDEPENDENT_AMBULATORY_CARE_PROVIDER_SITE_OTHER): Payer: BC Managed Care – PPO | Admitting: Obstetrics and Gynecology

## 2020-10-11 VITALS — BP 142/70 | HR 75 | Ht <= 58 in | Wt 112.0 lb

## 2020-10-11 DIAGNOSIS — Z01419 Encounter for gynecological examination (general) (routine) without abnormal findings: Secondary | ICD-10-CM

## 2020-10-11 NOTE — Patient Instructions (Signed)
EXERCISE AND DIET:  We recommended that you start or continue a regular exercise program for good health. Regular exercise means any activity that makes your heart beat faster and makes you sweat.  We recommend exercising at least 30 minutes per day at least 3 days a week, preferably 4 or 5.  We also recommend a diet low in fat and sugar.  Inactivity, poor dietary choices and obesity can cause diabetes, heart attack, stroke, and kidney damage, among others.    ALCOHOL AND SMOKING:  Women should limit their alcohol intake to no more than 7 drinks/beers/glasses of wine (combined, not each!) per week. Moderation of alcohol intake to this level decreases your risk of breast cancer and liver damage. And of course, no recreational drugs are part of a healthy lifestyle.  And absolutely no smoking or even second hand smoke. Most people know smoking can cause heart and lung diseases, but did you know it also contributes to weakening of your bones? Aging of your skin?  Yellowing of your teeth and nails?  CALCIUM AND VITAMIN D:  Adequate intake of calcium and Vitamin D are recommended.  The recommendations for exact amounts of these supplements seem to change often, but generally speaking 600 mg of calcium (either carbonate or citrate) and 800 units of Vitamin D per day seems prudent. Certain women may benefit from higher intake of Vitamin D.  If you are among these women, your doctor will have told you during your visit.    PAP SMEARS:  Pap smears, to check for cervical cancer or precancers,  have traditionally been done yearly, although recent scientific advances have shown that most women can have pap smears less often.  However, every woman still should have a physical exam from her gynecologist every year. It will include a breast check, inspection of the vulva and vagina to check for abnormal growths or skin changes, a visual exam of the cervix, and then an exam to evaluate the size and shape of the uterus and  ovaries.  And after 62 years of age, a rectal exam is indicated to check for rectal cancers. We will also provide age appropriate advice regarding health maintenance, like when you should have certain vaccines, screening for sexually transmitted diseases, bone density testing, colonoscopy, mammograms, etc.   MAMMOGRAMS:  All women over 40 years old should have a yearly mammogram. Many facilities now offer a "3D" mammogram, which may cost around $50 extra out of pocket. If possible,  we recommend you accept the option to have the 3D mammogram performed.  It both reduces the number of women who will be called back for extra views which then turn out to be normal, and it is better than the routine mammogram at detecting truly abnormal areas.    COLONOSCOPY:  Colonoscopy to screen for colon cancer is recommended for all women at age 50.  We know, you hate the idea of the prep.  We agree, BUT, having colon cancer and not knowing it is worse!!  Colon cancer so often starts as a polyp that can be seen and removed at colonscopy, which can quite literally save your life!  And if your first colonoscopy is normal and you have no family history of colon cancer, most women don't have to have it again for 10 years.  Once every ten years, you can do something that may end up saving your life, right?  We will be happy to help you get it scheduled when you are ready.    Be sure to check your insurance coverage so you understand how much it will cost.  It may be covered as a preventative service at no cost, but you should check your particular policy.     Raloxifene tablets What is this medicine? RALOXIFENE (ral OX i feen) reduces the amount of calcium lost from bones. It is used to treat and prevent osteoporosis in women who have experienced menopause. It may also help prevent invasive breast cancer in certain women who have a high risk for breast cancer. This medicine may be used for other purposes; ask your health care  provider or pharmacist if you have questions. COMMON BRAND NAME(S): Evista What should I tell my health care provider before I take this medicine? They need to know if you have any of these conditions:  a history of blood clots  cancer  heart disease or recent heart attack  high levels of triglycerides (blood fat) in the blood  history of stroke  kidney disease  liver disease  premenopausal  smoke tobacco  an unusual or allergic reaction to raloxifene, other medicines, foods, dyes, or preservatives  pregnant or trying to get pregnant  breast-feeding How should I use this medicine? Take this medicine by mouth with a glass of water. Follow the directions on the prescription label. The tablets can be taken with or without food. Take your doses at regular intervals. Do not take your medicine more often than directed. A special MedGuide will be given to you by the pharmacist with each prescription and refill. Be sure to read this information carefully each time. Talk to your pediatrician regarding the use of this medicine in children. Special care may be needed. Overdosage: If you think you have taken too much of this medicine contact a poison control center or emergency room at once. NOTE: This medicine is only for you. Do not share this medicine with others. What if I miss a dose? If you miss a dose, take it as soon as you can. If it is almost time for your next dose, take only that dose. Do not take double or extra doses. What may interact with this medicine?  cholestyramine  female hormones, like estrogens  warfarin This list may not describe all possible interactions. Give your health care provider a list of all the medicines, herbs, non-prescription drugs, or dietary supplements you use. Also tell them if you smoke, drink alcohol, or use illegal drugs. Some items may interact with your medicine. What should I watch for while using this medicine? Visit your doctor or  health care professional for regular checks on your progress. Do not stop taking this medicine except on the advice of your doctor or health care professional. If you are taking this medicine to reduce your risk of getting breast cancer, you should know that this medicine does not prevent all types of breast cancer. Talk to your doctor if you have questions. This medicine does not prevent hot flashes. It may cause hot flashes in some patients at the start of therapy. You should make sure that you get enough calcium and vitamin D while you are taking this medicine. Discuss the foods you eat and the vitamins you take with your health care professional. Exercise may help to prevent bone loss. Discuss your exercise needs with your doctor or health care professional. This medicine can rarely cause blood clots. If you are going to have surgery, tell your doctor or health care professional that you are taking this medicine. This  medicine should be stopped at least 3 days before surgery. After surgery, it should be restarted only after you are walking again. It should not be restarted while you still need long periods of bed rest. You should not smoke while taking this medicine. Smoking may increase your risk of blood clots or stroke. If you have any reason to think you are pregnant; stop taking this medicine at once and contact your doctor or health care professional. Do not breast feed while taking this medicine. What side effects may I notice from receiving this medicine? Side effects that you should report to your doctor or health care professional as soon as possible:  allergic reactions like skin rash, itching or hives, swelling of the face, lips, or tongue)  breast tissue changes or discharge  signs and symptoms of a blood clot such as breathing problems; changes in vision; chest pain; severe, sudden headache; pain, swelling, warmth in the leg; trouble speaking; sudden numbness or weakness of the  face, arm or leg  signs and symptoms of a stroke like changes in vision; confusion; trouble speaking or understanding; severe headaches; sudden numbness or weakness of the face, arm or leg; trouble walking; dizziness; loss of balance or coordination  vaginal discharge that is bloody, brown, or rust Side effects that usually do not require medical attention (report to your doctor or health care professional if they continue or are bothersome):  hot flashes  joint pain  leg cramps  sweating  swelling of the ankles, feet, hands This list may not describe all possible side effects. Call your doctor for medical advice about side effects. You may report side effects to FDA at 1-800-FDA-1088. Where should I keep my medicine? Keep out of the reach of children. Store at room temperature between 15 and 30 degrees C (59 and 86 degrees F). Throw away any unused medicine after the expiration date. NOTE: This sheet is a summary. It may not cover all possible information. If you have questions about this medicine, talk to your doctor, pharmacist, or health care provider.  2020 Elsevier/Gold Standard (2016-11-20 17:15:34)

## 2020-10-12 IMAGING — US US AXILLARY RIGHT
1 series · 4 of 4 positions shown · non-contrast
Comparison: Previous exam(s).

CLINICAL DATA: Personal history right breast cancer status post
lumpectomy 9228. Palpable lump in right axilla.

EXAM:
DIGITAL DIAGNOSTIC right MAMMOGRAM WITH CAD AND TOMO
ULTRASOUND right BREAST

[Series 1: us axillary right · 0.06mm/px · 4 of 4 slices shown]
[im 1/4]
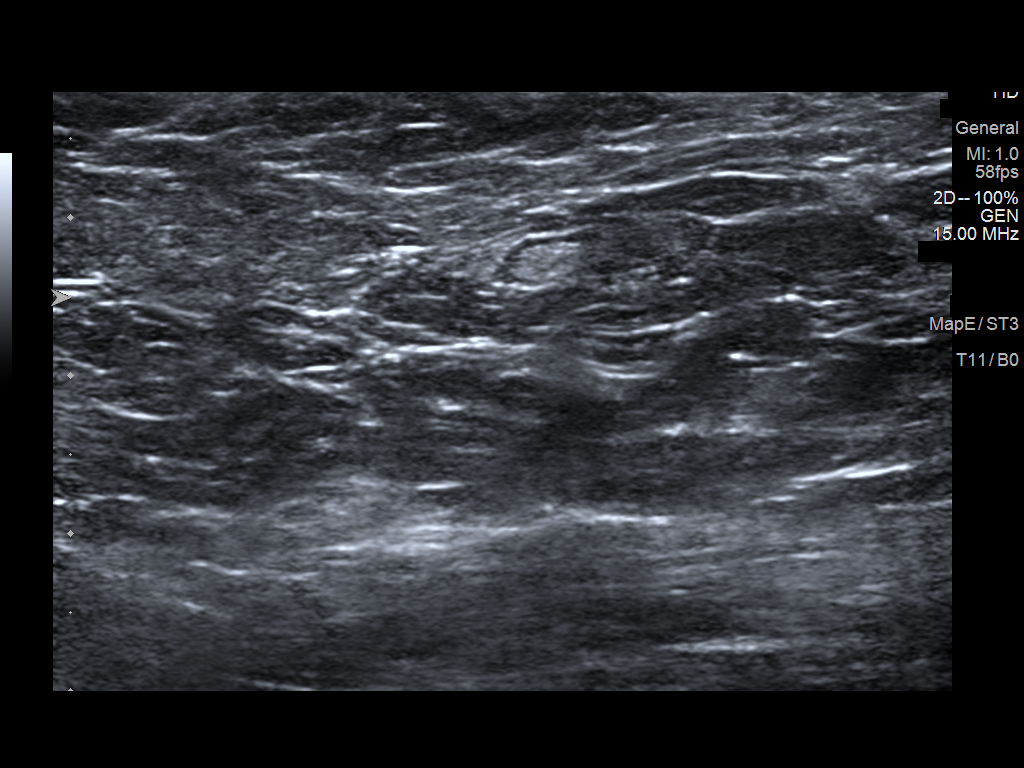
[im 2/4]
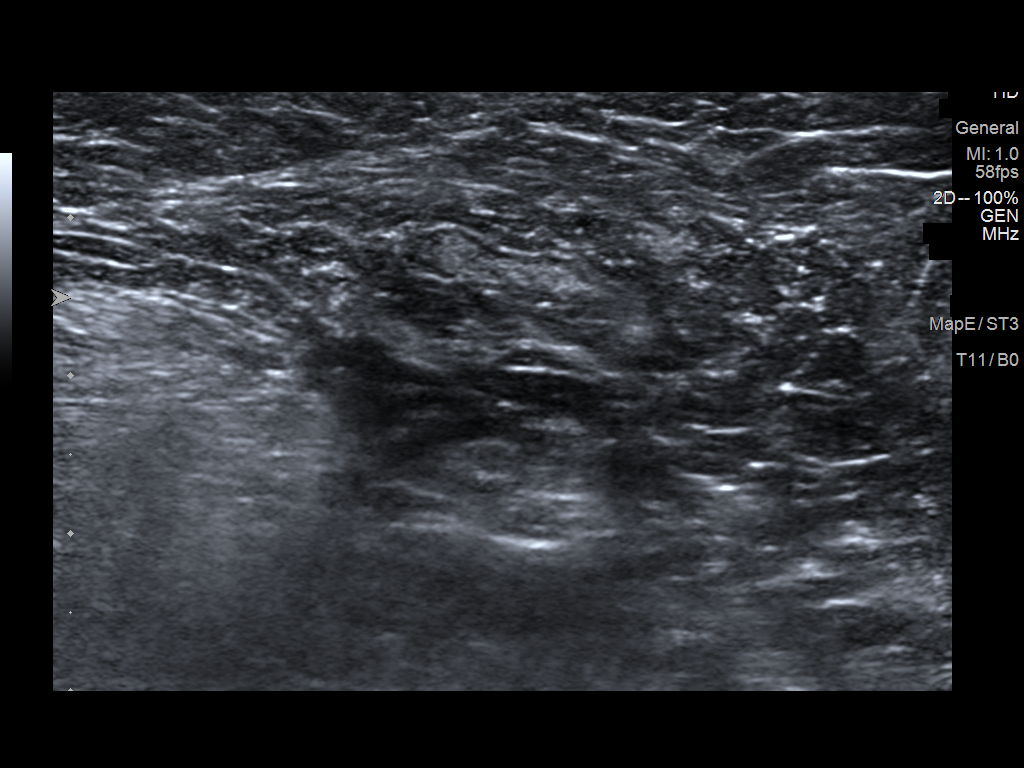
[im 3/4]
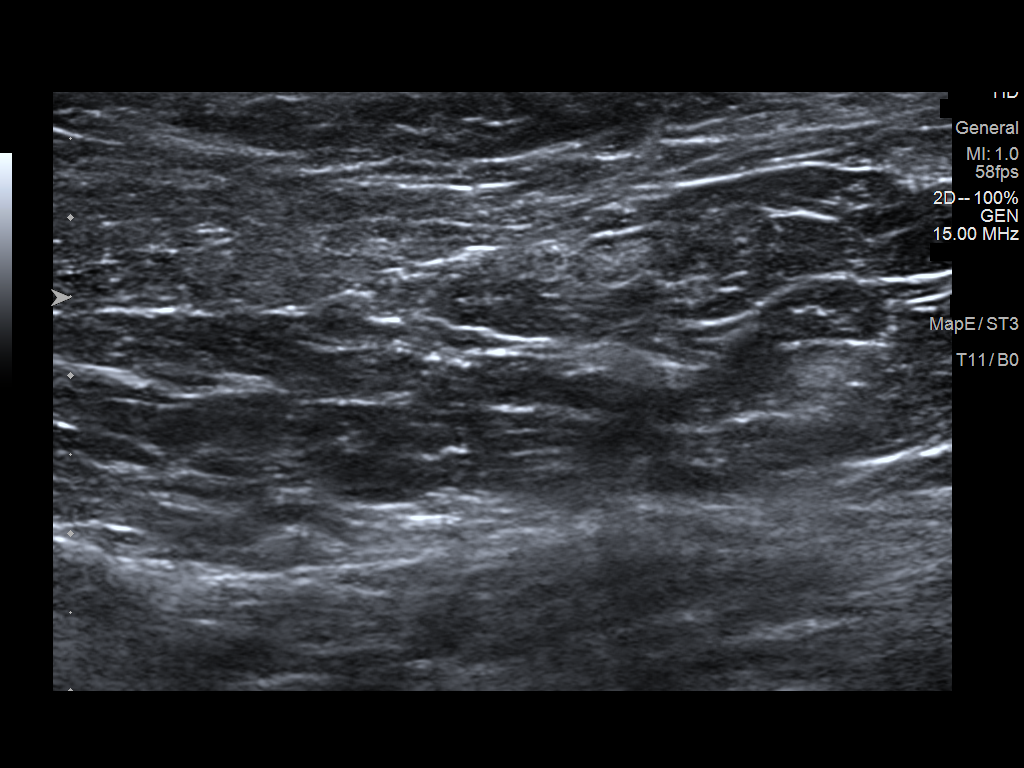
[im 4/4]
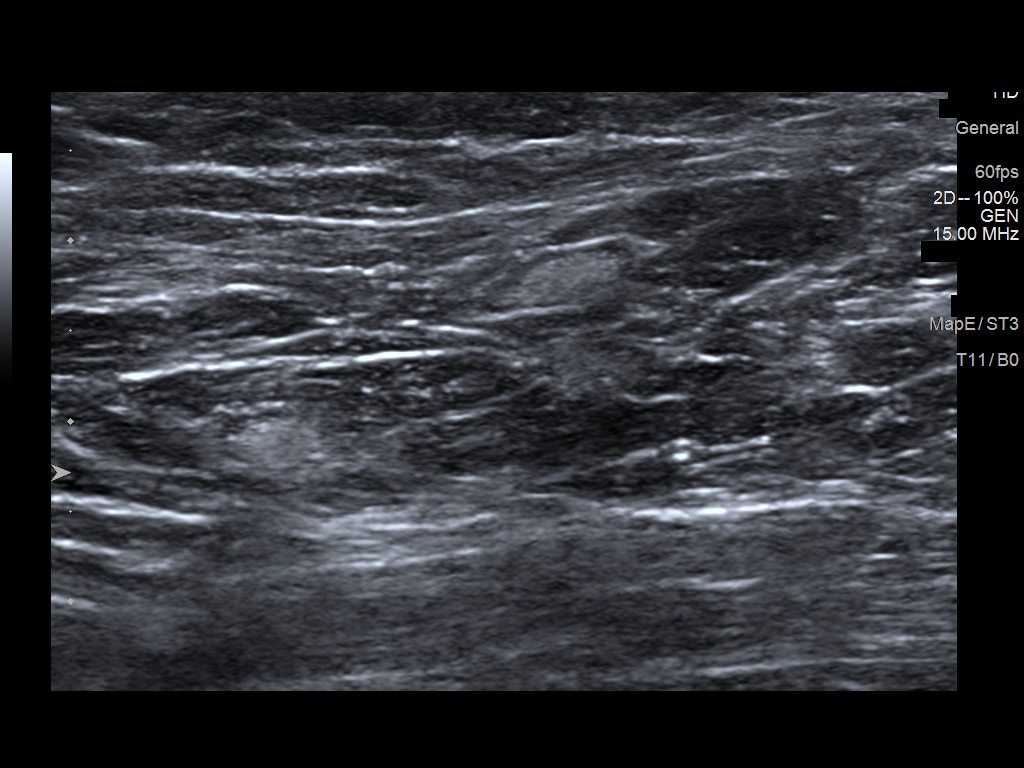

[4 of 4 positions shown; findings below may reference images not displayed]

ACR Breast Density Category b: There are scattered areas of
fibroglandular density.
FINDINGS: Cc and MLO views of the right breast, spot tangential view of right
are submitted. Stable postsurgical changes are identified in the
right breast. No suspicious abnormality is identified.

Mammographic images were processed with CAD.

Targeted ultrasound is performed, showing no focal abnormal discrete
cystic or solid lesion in the palpable area right axilla.
IMPRESSION: Benign findings.

RECOMMENDATION:
Routine screening mammogram back on schedule.

I have discussed the findings and recommendations with the patient.
If applicable, a reminder letter will be sent to the patient
regarding the next appointment.

BI-RADS CATEGORY  2: Benign.

## 2020-10-12 IMAGING — MG MM DIGITAL DIAGNOSTIC UNILAT*R* W/ TOMO W/ CAD
6 series · 6 of 18 positions shown · non-contrast
Comparison: Previous exam(s).

CLINICAL DATA: Personal history right breast cancer status post
lumpectomy 9228. Palpable lump in right axilla.

EXAM:
DIGITAL DIAGNOSTIC right MAMMOGRAM WITH CAD AND TOMO
ULTRASOUND right BREAST

[R CC synth-2D]
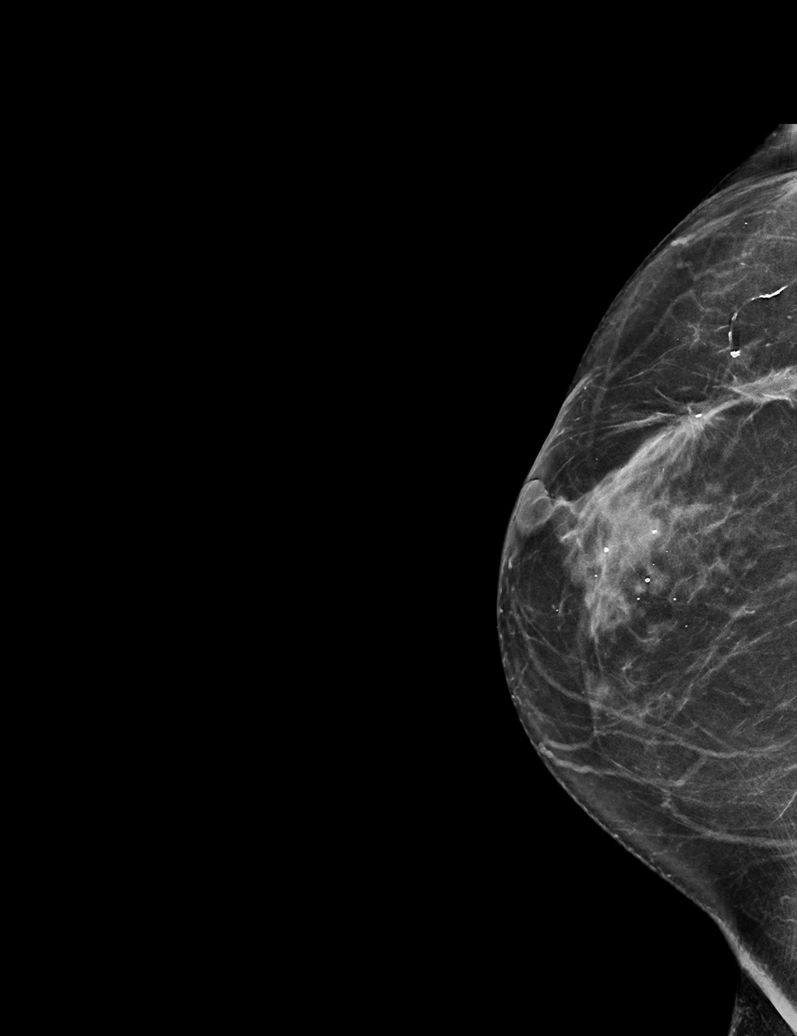

[R MLO synth-2D]
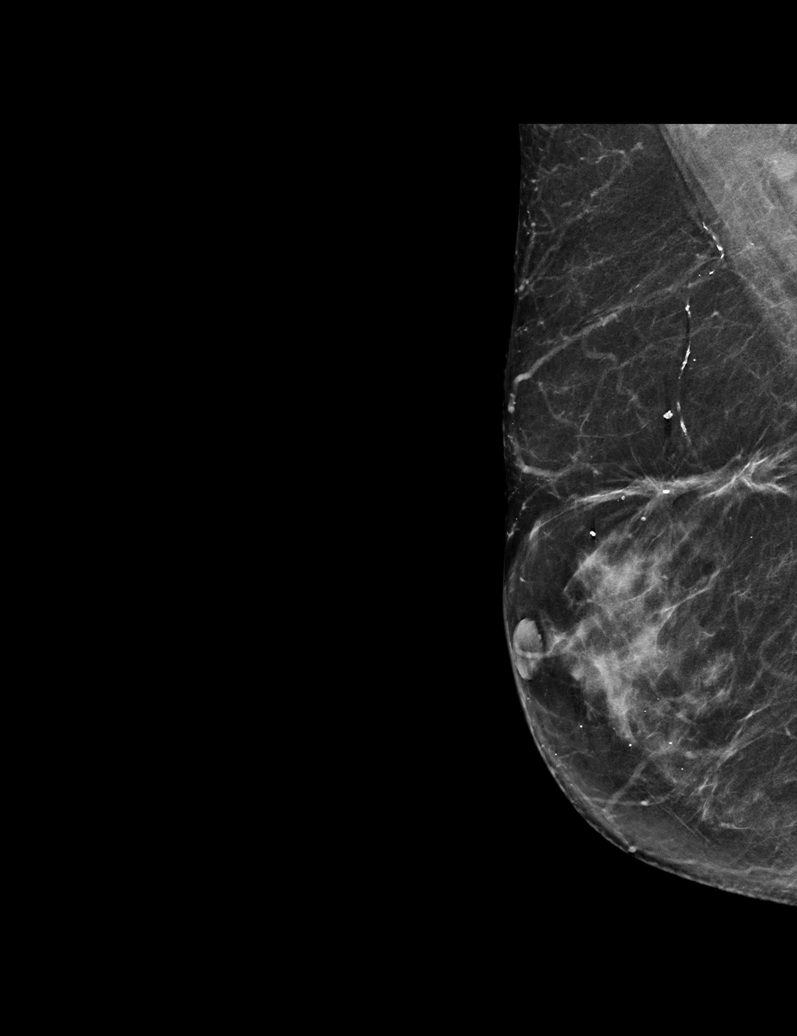

[R TAN synth-2D]
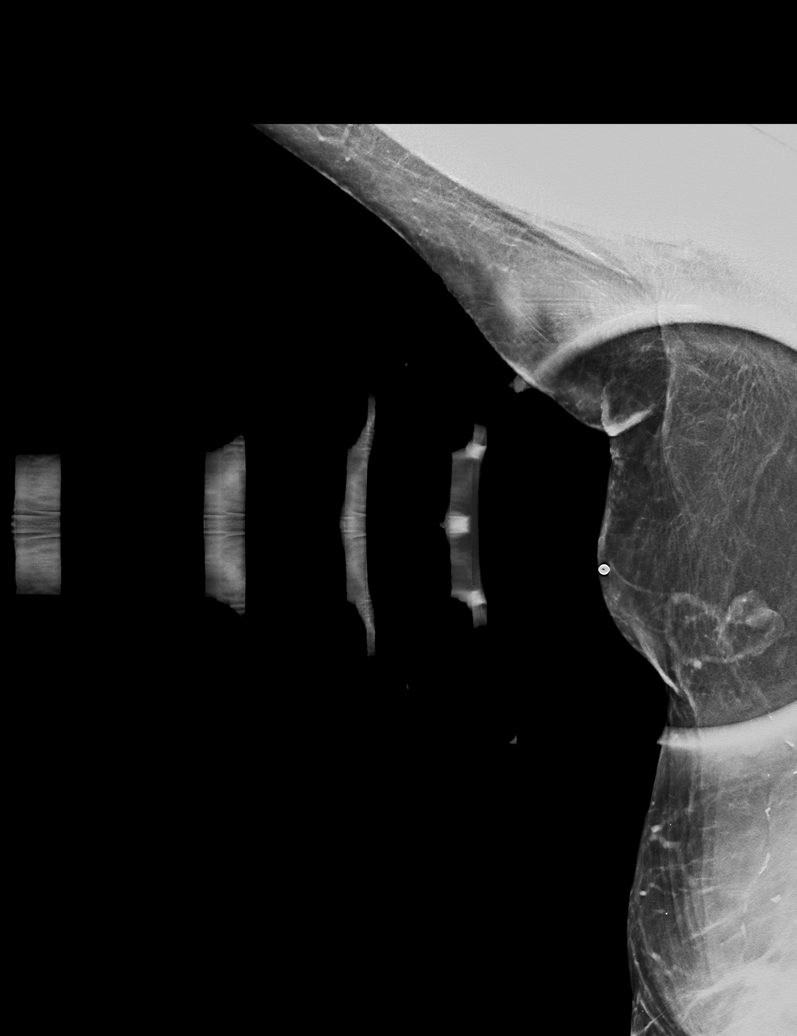

[R CC tomo · tomo slice 31/62.0]
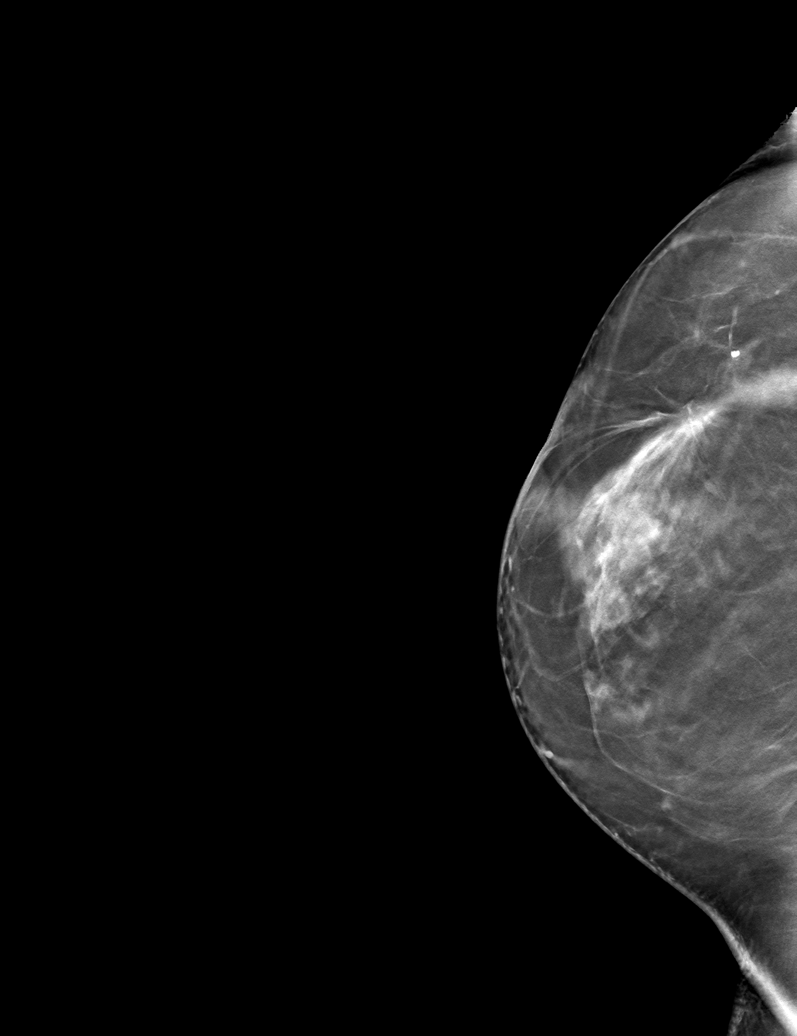

[R MLO tomo · tomo slice 31/62.0]
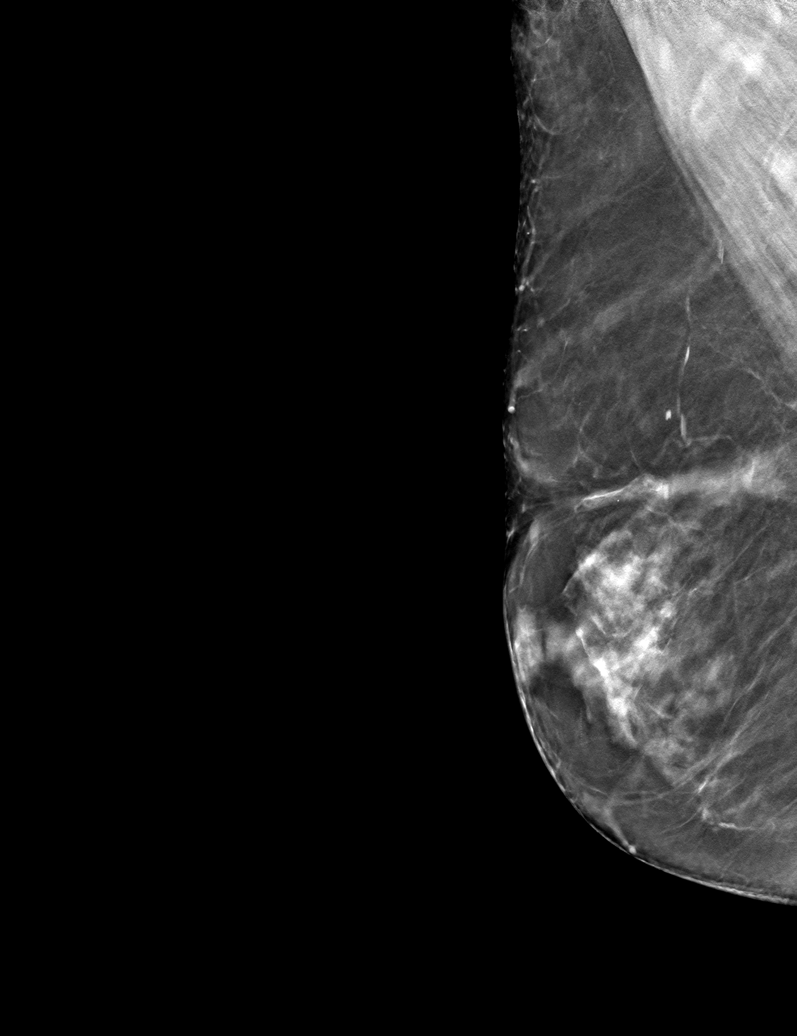

[R TAN tomo · tomo slice 29/58.0]
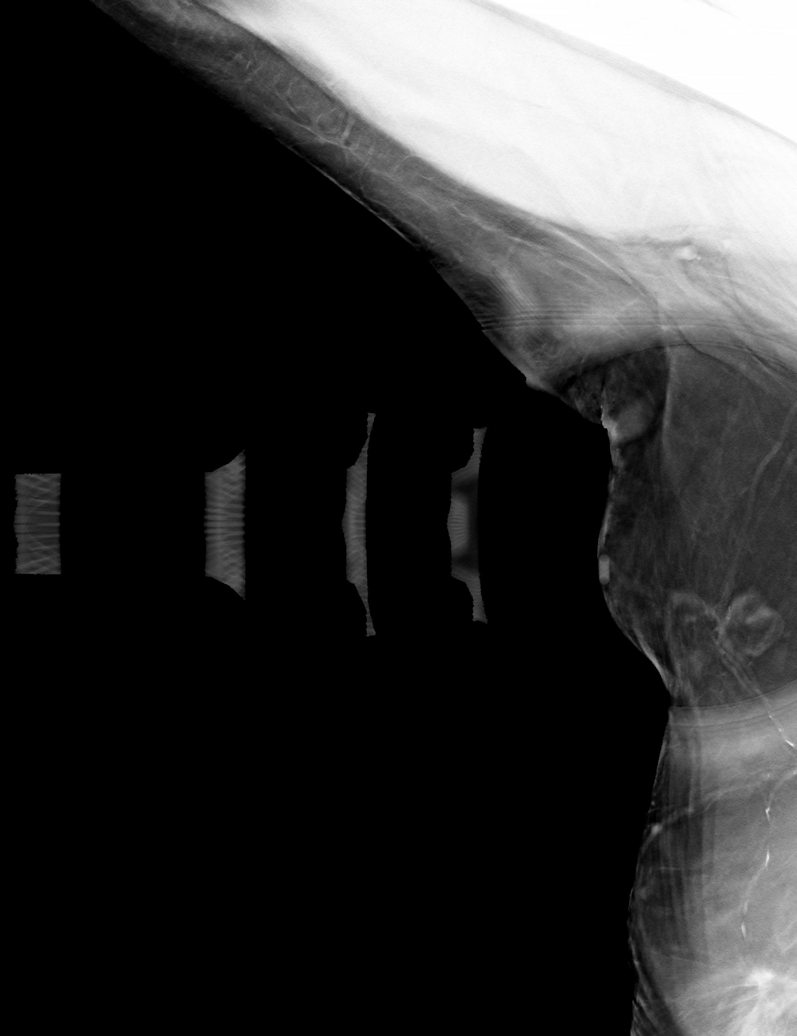

[6 of 18 positions shown; findings below may reference images not displayed]

ACR Breast Density Category b: There are scattered areas of
fibroglandular density.
FINDINGS: Cc and MLO views of the right breast, spot tangential view of right
are submitted. Stable postsurgical changes are identified in the
right breast. No suspicious abnormality is identified.

Mammographic images were processed with CAD.

Targeted ultrasound is performed, showing no focal abnormal discrete
cystic or solid lesion in the palpable area right axilla.
IMPRESSION: Benign findings.

RECOMMENDATION:
Routine screening mammogram back on schedule.

I have discussed the findings and recommendations with the patient.
If applicable, a reminder letter will be sent to the patient
regarding the next appointment.

BI-RADS CATEGORY  2: Benign.

## 2020-11-03 ENCOUNTER — Ambulatory Visit
Admission: RE | Admit: 2020-11-03 | Discharge: 2020-11-03 | Disposition: A | Discharge status: Home / Self Care | Payer: BC Managed Care – PPO | Source: Ambulatory Visit | Attending: Internal Medicine | Admitting: Internal Medicine

## 2020-11-03 ENCOUNTER — Other Ambulatory Visit: Payer: Self-pay

## 2020-11-03 DIAGNOSIS — Z1231 Encounter for screening mammogram for malignant neoplasm of breast: Secondary | ICD-10-CM

## 2021-03-16 DIAGNOSIS — E785 Hyperlipidemia, unspecified: Secondary | ICD-10-CM | POA: Diagnosis not present

## 2021-03-16 DIAGNOSIS — E119 Type 2 diabetes mellitus without complications: Secondary | ICD-10-CM | POA: Diagnosis not present

## 2021-03-16 DIAGNOSIS — M810 Age-related osteoporosis without current pathological fracture: Secondary | ICD-10-CM | POA: Diagnosis not present

## 2021-03-23 DIAGNOSIS — R82998 Other abnormal findings in urine: Secondary | ICD-10-CM | POA: Diagnosis not present

## 2021-03-23 DIAGNOSIS — Z0000 Encounter for general adult medical examination without abnormal findings: Secondary | ICD-10-CM | POA: Diagnosis not present

## 2021-03-23 DIAGNOSIS — Z1339 Encounter for screening examination for other mental health and behavioral disorders: Secondary | ICD-10-CM | POA: Diagnosis not present

## 2021-03-23 DIAGNOSIS — Z1331 Encounter for screening for depression: Secondary | ICD-10-CM | POA: Diagnosis not present

## 2021-03-23 DIAGNOSIS — I10 Essential (primary) hypertension: Secondary | ICD-10-CM | POA: Diagnosis not present

## 2021-08-07 ENCOUNTER — Other Ambulatory Visit: Payer: Self-pay | Admitting: Internal Medicine

## 2021-08-07 DIAGNOSIS — Z1231 Encounter for screening mammogram for malignant neoplasm of breast: Secondary | ICD-10-CM

## 2021-11-06 ENCOUNTER — Ambulatory Visit: Payer: BC Managed Care – PPO | Admitting: Obstetrics and Gynecology

## 2021-11-09 ENCOUNTER — Ambulatory Visit
Admission: RE | Admit: 2021-11-09 | Discharge: 2021-11-09 | Disposition: A | Discharge status: Home / Self Care | Payer: BC Managed Care – PPO | Source: Ambulatory Visit | Attending: Internal Medicine | Admitting: Internal Medicine

## 2021-11-09 DIAGNOSIS — Z1231 Encounter for screening mammogram for malignant neoplasm of breast: Secondary | ICD-10-CM

## 2021-11-13 ENCOUNTER — Other Ambulatory Visit: Payer: Self-pay | Admitting: Internal Medicine

## 2021-11-13 DIAGNOSIS — R928 Other abnormal and inconclusive findings on diagnostic imaging of breast: Secondary | ICD-10-CM

## 2021-11-19 ENCOUNTER — Other Ambulatory Visit: Payer: Self-pay | Admitting: Internal Medicine

## 2021-11-19 ENCOUNTER — Ambulatory Visit
Admission: RE | Admit: 2021-11-19 | Discharge: 2021-11-19 | Disposition: A | Discharge status: Home / Self Care | Payer: BC Managed Care – PPO | Source: Ambulatory Visit | Attending: Internal Medicine | Admitting: Internal Medicine

## 2021-11-19 ENCOUNTER — Ambulatory Visit: Payer: BC Managed Care – PPO

## 2021-11-19 DIAGNOSIS — Z853 Personal history of malignant neoplasm of breast: Secondary | ICD-10-CM | POA: Diagnosis not present

## 2021-11-19 DIAGNOSIS — R921 Mammographic calcification found on diagnostic imaging of breast: Secondary | ICD-10-CM

## 2021-11-19 DIAGNOSIS — R922 Inconclusive mammogram: Secondary | ICD-10-CM | POA: Diagnosis not present

## 2021-11-19 DIAGNOSIS — R928 Other abnormal and inconclusive findings on diagnostic imaging of breast: Secondary | ICD-10-CM

## 2021-11-22 NOTE — Progress Notes (Signed)
66 y.o. G15P1001 Divorced [place] female here for annual exam.    Has a breast biopsy scheduled for this week.   Still with urinary incontinence.  Not ready to do any treatment at this time.  Has first grandchild--2 months old, a girl.  PCP: Berneta Sages, MD  Patient's last menstrual period was 06/28/2010.           Sexually active: No.  The current method of family planning is post menopausal status.    Exercising: Yes.     Walking when she can Smoker:  no  Health Maintenance: Pap:  06-17-18 Neg:Neg HR HPV, 04-28-15 Neg:Neg HR HPV, 02-07-11 Neg  History of abnormal Pap:   Hx of condyloma MMG:  11-09-21 Rt.lumpectomy 2005/Rt.Br.Neg;Lt.Br.poss.asymmetry and calcifications. Diag.Lt.Br.indeterminate calcifications/Pt.scheduled for breast bx 11-30-21. Colonoscopy:  2021 Cologuard Neg BMD: 05-05-20    Result:  severe osteoporosis of spine--sees PCP--refused treatment.  Aching with Fosamax.  Prolia expensive.  TDaP:  09-01-19 Gardasil:   no HIV:09-01-19 NR Hep C:09-01-19 Neg Screening Labs: PCP. Flu vaccine:  declined.  Covid vaccine:  one booster completed. Shingrix:  discussed.   reports that she has never smoked. She has never used smokeless tobacco. She reports that she does not drink alcohol and does not use drugs.  Past Medical History:  Diagnosis Date   Cancer [facility]) 2005   breast cancer, right   Diabetes mellitus without complication ([place])    Hypercholesteremia    Hypertension    Osteoporosis    Personal history of radiation therapy 2005   Right Breast Cancer   Urinary incontinence     Past Surgical History:  Procedure Laterality Date   BREAST LUMPECTOMY Right 2005   BREAST SURGERY Right 2005   lumpectomy   CESAREAN SECTION  1991    Current Outpatient Medications  Medication Sig Dispense Refill   amLODipine (NORVASC) 10 MG tablet Take 1 tablet by mouth daily.     Cholecalciferol (VITAMIN D PO) Take 2,000 Int'l Units by mouth.     ezetimibe (ZETIA) 10 MG tablet Take 10 mg  by mouth daily.     metFORMIN (GLUCOPHAGE) 500 MG tablet TAKE 1 TABLET BY MOUTH DAILY WITH LARGEST MEAL OF THE DAY  3   pantoprazole (PROTONIX) 40 MG tablet Take 40 mg by mouth daily.     TOPROL XL 100 MG 24 hr tablet Take 1 tablet by mouth daily.     No current facility-administered medications for this visit.    Family History  Problem Relation Age of Onset   Heart disease Mother    Diabetes Father    Breast cancer Sister 76       bilateral breast cancer   Cancer Sister    Hypertension Brother     Review of Systems  All other systems reviewed and are negative.  Exam:   BP (!) 104/58    Pulse 83    Ht 4\' 9"  (1.448 m)    Wt 109 lb (49.4 kg)    LMP 06/28/2010    SpO2 97%    BMI 23.59 kg/m     General appearance: alert, cooperative and appears stated age Head: normocephalic, without obvious abnormality, atraumatic Neck: no adenopathy, supple, symmetrical, trachea midline and thyroid normal to inspection and palpation Lungs: clear to auscultation bilaterally Breasts: normal appearance, no masses or tenderness, No nipple retraction or dimpling, No nipple discharge or bleeding, No axillary adenopathy Heart: regular rate and rhythm Abdomen: soft, non-tender; no masses, no organomegaly Extremities: extremities normal, atraumatic, no  cyanosis or edema Skin: skin color, texture, turgor normal. No rashes or lesions Lymph nodes: cervical, supraclavicular, and axillary nodes normal. Neurologic: grossly normal  Pelvic: External genitalia:  no lesions              No abnormal inguinal nodes palpated.              Urethra:  normal appearing urethra with no masses, tenderness or lesions              Bartholins and Skenes: normal                 Vagina: normal appearing vagina with normal color and discharge, no lesions              Cervix: no lesions              Pap taken: no Bimanual Exam:  Uterus:  normal size, contour, position, consistency, mobility, non-tender              Adnexa:  no mass, fullness, tenderness              Rectal exam: yes.  Confirms.              Anus:  normal sphincter tone, no lesions  Chaperone was present for exam:  Estill Bamberg, CMA.  Assessment:   Well woman visit with gynecologic exam. Remote hx of condyloma.  Hx right breast cancer.  Abnormal left mammogram.  Biopsy planned. Osteoporosis.  Off Prolia. Mixed incontinence.   Plan: Mammogram screening discussed. Left breast biopsy this week.  Pap and HR HPV 2024. Guidelines for Calcium, Vitamin D, regular exercise program including cardiovascular and weight bearing exercise. I did mention to possibility of seeing an endocrinologist to talk about treating versus not treating osteoporosis.   Follow up annually and prn.   After visit summary provided.

## 2021-11-26 ENCOUNTER — Ambulatory Visit (INDEPENDENT_AMBULATORY_CARE_PROVIDER_SITE_OTHER): Payer: BC Managed Care – PPO | Admitting: Obstetrics and Gynecology

## 2021-11-26 ENCOUNTER — Encounter: Payer: Self-pay | Admitting: Obstetrics and Gynecology

## 2021-11-26 ENCOUNTER — Other Ambulatory Visit: Payer: Self-pay

## 2021-11-26 VITALS — BP 104/58 | HR 83 | Ht <= 58 in | Wt 109.0 lb

## 2021-11-26 DIAGNOSIS — Z01419 Encounter for gynecological examination (general) (routine) without abnormal findings: Secondary | ICD-10-CM

## 2021-11-26 NOTE — Patient Instructions (Signed)

## 2021-11-30 ENCOUNTER — Ambulatory Visit
Admission: RE | Admit: 2021-11-30 | Discharge: 2021-11-30 | Disposition: A | Discharge status: Home / Self Care | Payer: BC Managed Care – PPO | Source: Ambulatory Visit | Attending: Internal Medicine | Admitting: Internal Medicine

## 2021-11-30 DIAGNOSIS — D0512 Intraductal carcinoma in situ of left breast: Secondary | ICD-10-CM | POA: Diagnosis not present

## 2021-11-30 DIAGNOSIS — R921 Mammographic calcification found on diagnostic imaging of breast: Secondary | ICD-10-CM

## 2021-11-30 DIAGNOSIS — N6489 Other specified disorders of breast: Secondary | ICD-10-CM | POA: Diagnosis not present

## 2021-12-13 ENCOUNTER — Other Ambulatory Visit: Payer: BC Managed Care – PPO

## 2021-12-20 DIAGNOSIS — D0512 Intraductal carcinoma in situ of left breast: Secondary | ICD-10-CM | POA: Diagnosis not present

## 2021-12-21 ENCOUNTER — Telehealth: Payer: Self-pay | Admitting: Radiation Oncology

## 2021-12-21 NOTE — Telephone Encounter (Signed)
LVM to schedule CON with Dr.Squire 

## 2021-12-24 ENCOUNTER — Telehealth: Payer: Self-pay | Admitting: Hematology and Oncology

## 2021-12-24 NOTE — Telephone Encounter (Signed)
Scheduled appt per 2/27 staff msg from nurse navigator. Called pt, no answer. Left msg with appt date and time and requested for pt to call me back to confirm appt. Left my direct number for pt to call back to.

## 2021-12-25 ENCOUNTER — Other Ambulatory Visit: Payer: Self-pay | Admitting: General Surgery

## 2021-12-25 ENCOUNTER — Telehealth: Payer: Self-pay | Admitting: Hematology and Oncology

## 2021-12-25 DIAGNOSIS — D0512 Intraductal carcinoma in situ of left breast: Secondary | ICD-10-CM

## 2021-12-25 NOTE — Telephone Encounter (Signed)
Scheduled appt per 2/27 staff msg from nurse navigator. Called pt, no answer. Left msg with appt date and time and requested for pt to call me back to confirm appt. Left my direct number for pt to call back to.

## 2021-12-27 NOTE — Progress Notes (Signed)
Location of Breast Cancer:  ?Ductal carcinoma in situ (DCIS) or left breast ? ?Histology per Pathology Report:  ?(Definitive pathology pending upcoming lumpectomy) ?11/30/2021 ?Diagnosis ?1. Breast, left, needle core biopsy, upper outer quadrant ?- FIBROADENOMATOID CHANGE WITH DYSTROPHIC CALCIFICATIONS ?- NO MALIGNANCY IDENTIFIED ?2. Breast, left, needle core biopsy, retroareolar ?- DUCTAL CARCINOMA IN SITU WITH CALCIFICATIONS AND NECROSIS ?- SEE COMMENT ?Microscopic Comment ?2. Based on the biopsy, the ductal carcinoma in situ has a comedo pattern, high nuclear grade and measures 0.6 cm in ?greatest linear extent. Prognostic markers (ER/PR) are pending and will be reported in an addendum. ? ?Receptor Status: ER(20%), PR (2%), ? ?Did patient present with symptoms (if so, please note symptoms) or was this found on screening mammography?: Screening mammogram noted noted to have: a couple areas of calcifications, as well as a 53mm retroareolar area and a 9 mm upper outer quadrant area.  ? ?Past/Anticipated interventions by surgeon, if any:  ?01/17/2022 ?--Dr. Rolm Bookbinder ?LEFT BREAST LUMPECTOMY WITH RADIOACTIVE SEED LOCALIZATION ? ?Past/Anticipated interventions by medical oncology, if any:  ?Scheduled for consultation with Dr. Nicholas Lose on 01/07/2022 ? ?Lymphedema issues, if any:  Patient denies   ? ?Pain issues, if any:  Patient denies  ? ?SAFETY ISSUES: ?Prior radiation? Yes back in 2005 for right sided breast cancer ?Pacemaker/ICD? No ?Possible current pregnancy? No--postmenopausal  ?Is the patient on methotrexate? No ? ?Current Complaints / other details:  Nothing else of note ?   ? ? ? ?

## 2021-12-27 NOTE — Progress Notes (Signed)
Radiation Oncology         [phone] ________________________________  Initial Outpatient Consultation  Name: Andrea [name] MRN: [id number]  Date: 12/28/2021  DOB: Sep 23, 1956  OE:UMPN, Ravisankar, MD  Rolm Bookbinder, MD   REFERRING PHYSICIAN: Rolm Bookbinder, MD  DIAGNOSIS:    ICD-10-CM   1. Ductal carcinoma in situ (DCIS) of left breast  D05.12      Stage 0 Left Breast Retroareolar, High-grade ductal carcinoma in-situ, ER+ / PR+ / Her2 not assessed (Estrogen Receptor: 20%, POSITIVE, STRONG STAINING INTENSITY Progesterone Receptor: 2%, POSITIVE, STRONG STAINING INTENSITY)  (History of right breast IDC in 2005 s/p lumpectomy, radiation, and antiestrogens)  CHIEF COMPLAINT: Here to discuss management of left breast DCIS  HISTORY OF PRESENT ILLNESS::Andrea [name] is a 66 y.o. female who presented with left breast abnormality on the following imaging: bilateral screening mammogram on the date of 11/09/21.  No symptoms, if any, were reported at that time, though indications for imaging were a history a right breast cancer.   Left breast diagnostic mammogram on 11/19/21 further revealed an indeterminate group of calcifications in the immediate retroareolar region measuring 5 mm, and an indeterminate group of calcifications in the upper outer quadrant measuring 9 mm. No abnormal lymph nodes were visualized.   Left breast retroareolar biopsy on the date of 11/30/21 showed high-grade ductal carcinoma in-situ measuring 0.6 cm in the greatest linear extent, with calcifications and necrosis. Left breast UOQ biopsy also performed revealed no evidence of malignancy, with fibroadenomatoid changes and dystrophic calcifications.  ER status: 20% positive; PR status 2% positive (both with strong staining intensity), Her2 not assessed. No lymph nodes were examined.  The patient has  met with Dr. Donne Hazel and is scheduled for left breast lumpectomy on 01/17/22.   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 Here with her ex husband today; he is a good friend. She has a new granddaughter.  PREVIOUS RADIATION THERAPY: Yes, ER positive invasive ductal carcinoma in 2005 that was treated with right breast lumpectomy, sentinel node biopsy, radiation, and tamoxifen for 5 years (history per Dr. Donne Hazel, no record of RT on EMR presently).   PAST MEDICAL HISTORY:  has a past medical history of Cancer ([place]) (2005), Diabetes mellitus without complication ([place]), Hypercholesteremia, Hypertension, Osteoporosis, Personal history of radiation therapy (2005), and Urinary incontinence.    PAST SURGICAL HISTORY: Past Surgical History:  Procedure Laterality Date   BREAST LUMPECTOMY Right 2005   BREAST SURGERY Right 2005   lumpectomy   CESAREAN SECTION  1991    FAMILY HISTORY: family history includes Breast cancer (age of onset: 34) in her sister; Cancer in her sister; Diabetes in her father; Heart disease in her mother; Hypertension in her brother.  SOCIAL HISTORY:  reports that she has never smoked. She has never used smokeless tobacco. She reports that she does not drink alcohol and does not use drugs.  ALLERGIES: Patient has no active allergies.  MEDICATIONS:  Current Outpatient Medications  Medication Sig Dispense Refill   Cyanocobalamin (VITAMIN B 12) 500 MCG TABS Take 1 tablet by mouth daily.     amLODipine (NORVASC) 10 MG tablet Take 1 tablet by mouth daily.     Cholecalciferol (VITAMIN D PO) Take 2,000 Int'l Units by mouth.     ezetimibe (ZETIA) 10 MG tablet Take 10 mg by mouth daily.     metFORMIN (GLUCOPHAGE) 500 MG tablet TAKE 1 TABLET BY MOUTH DAILY WITH LARGEST MEAL OF THE DAY  3  TOPROL XL 100 MG 24 hr tablet Take 1 tablet by mouth daily.     No current facility-administered medications for this encounter.    REVIEW OF SYSTEMS: As above in HPI.   PHYSICAL EXAM:  weight is 108 lb 6 oz (49.2 kg). Her blood pressure is 145/69 (abnormal) and her pulse is 71. Her  respiration is 17 and oxygen saturation is 98%.   General: Alert and oriented, in no acute distress Heart: Regular in rate and rhythm with no murmurs, rubs, or gallops. Chest: Clear to auscultation bilaterally, with no rhonchi, wheezes, or rales. Extremities: No cyanosis or edema. Lymphatics: see axillary/breast exam Skin: No concerning lesions. Musculoskeletal: ambulatory  Neurologic: EOMI, alert and oriented, strength symmetric in extremities Psychiatric: Judgment and insight are intact. Affect is appropriate. Breasts: no palpable masses appreciated in the breasts or axillae b/l. Skin is slightly bruised / erythematous at bx site (upper left breast) .    ECOG = 0  0 - Asymptomatic (Fully active, able to carry on all predisease activities without restriction)  1 - Symptomatic but completely ambulatory (Restricted in physically strenuous activity but ambulatory and able to carry out work of a light or sedentary nature. For example, light housework, office work)  2 - Symptomatic, <50% in bed during the day (Ambulatory and capable of all self care but unable to carry out any work activities. Up and about more than 50% of waking hours)  3 - Symptomatic, >50% in bed, but not bedbound (Capable of only limited self-care, confined to bed or chair 50% or more of waking hours)  4 - Bedbound (Completely disabled. Cannot carry on any self-care. Totally confined to bed or chair)  5 - Death   Eustace Pen MM, Creech RH, Tormey DC, et al. [phone]). "Toxicity and response criteria of the [facility]". [place] Oncol. 5 (6): 649-55   LABORATORY DATA:  Lab Results  Component Value Date   WBC 6.0 10/31/2009   HGB 14.8 10/31/2009   HCT 42.6 10/31/2009   MCV 92.7 10/31/2009   PLT 245 10/31/2009   CMP     Component Value Date/Time   NA 141 10/31/2009 1350   K 4.0 10/31/2009 1350   CL 104 10/31/2009 1350   CO2 27 10/31/2009 1350   GLUCOSE 100 (H) 10/31/2009 1350   BUN 15  10/31/2009 1350   CREATININE 0.45 10/31/2009 1350   CALCIUM 9.2 10/31/2009 1350   PROT 6.8 10/31/2009 1350   ALBUMIN 4.2 10/31/2009 1350   AST 22 10/31/2009 1350   ALT 19 10/31/2009 1350   ALKPHOS 66 10/31/2009 1350   BILITOT 0.3 10/31/2009 1350         RADIOGRAPHY: MM CLIP PLACEMENT LEFT  Result Date: 11/30/2021 CLINICAL DATA:  Evaluate post biopsy marker clip placements following stereotactic core needle biopsy of 2 groups of left breast calcifications. EXAM: 3D DIAGNOSTIC LEFT MAMMOGRAM POST STEREOTACTIC BIOPSY COMPARISON:  Previous exam(s). FINDINGS: 3D Mammographic images were obtained following stereotactic guided biopsy of 2 groups of left breast calcifications. The coil shaped biopsy marker clip lies in the posterior upper outer left breast adjacent to a few residual calcifications. The ribbon shaped biopsy clip lies in the retroareolar left breast adjacent to minimal residual calcifications. IMPRESSION: Appropriate positioning of the coil and ribbon shaped biopsy marking clips at the site of biopsy in the upper outer, posterior left breast and retroareolar left breast. Final Assessment: Post Procedure Mammograms for Marker Placement Electronically Signed   By: Lajean Manes  M.D.   On: 11/30/2021 09:13  MM LT BREAST BX W LOC DEV 1ST LESION IMAGE BX SPEC STEREO GUIDE  Addendum Date: 12/06/2021   ADDENDUM REPORT: 12/06/2021 07:32 ADDENDUM: Pathology revealed FIBROADENOMATOID CHANGE WITH DYSTROPHIC CALCIFICATIONS of the LEFT breast, upper outer quadrant, (coil clip). This was found to be concordant by Dr. Lajean Manes. Pathology revealed HIGH GRADE DUCTAL CARCINOMA IN SITU WITH CALCIFICATIONS AND NECROSIS of the LEFT breast, retroareolar, (ribbon clip). This was found to be concordant by Dr. Lajean Manes. Pathology results were discussed with the patient by telephone. The patient reported doing well after the biopsies with tenderness and redness at the sites. Post biopsy instructions and care  were reviewed and questions were answered. The patient was encouraged to call [facility] for any additional concerns. My direct phone number was provided. Surgical consultation has been arranged with Dr. Rolm Bookbinder, per patient request, at [facility] on December 20, 2021. Pathology results reported by Terie Purser, RN on 12/04/2021. Electronically Signed   By: Lajean Manes M.D.   On: 12/06/2021 07:32   Result Date: 12/06/2021 CLINICAL DATA:  Patient presents for stereotactic core needle biopsy of 2 groups of left breast calcifications. EXAM: LEFT BREAST STEREOTACTIC CORE NEEDLE BIOPSY: 2 BIOPSIES PERFORMED COMPARISON:  Previous exams. FINDINGS: The patient and I discussed the procedure of stereotactic-guided biopsy including benefits and alternatives. We discussed the high likelihood of a successful procedure. We discussed the risks of the procedure including infection, bleeding, tissue injury, clip migration, and inadequate sampling. Informed written consent was given. The usual time out protocol was performed immediately prior to the procedure. Biopsy #1: 9 mm group of calcifications, upper outer quadrant. Using sterile technique and 1% Lidocaine as local anesthetic, under stereotactic guidance, a 9 gauge vacuum assisted device was used to perform core needle biopsy of calcifications in the upper outer quadrant the left breast using a superior approach. Specimen radiograph was performed showing multiple calcifications for which biopsy was performed. Specimens with calcifications are identified for pathology. Lesion quadrant: Upper outer quadrant At the conclusion of the procedure, a coil shaped tissue marker clip was deployed into the biopsy cavity. Biopsy #2: 5 mm group of retroareolar calcifications. Using sterile technique and 1% Lidocaine as local anesthetic, under stereotactic guidance, a 9 gauge vacuum assisted device was used to perform core needle biopsy of  calcifications in the retroareolar left breast using a superior approach. Specimen radiograph was performed showing multiple calcifications for which biopsy was performed. Specimens with calcifications are identified for pathology. Lesion quadrant: Upper inner quadrant: Retroareolar. At the conclusion of the procedure, a ribbon shaped tissue marker clip was deployed into the biopsy cavity. Follow-up 2-view mammogram was performed and dictated separately. IMPRESSION: Stereotactic-guided biopsy of 2 groups of left breast calcifications. No apparent complications. Electronically Signed: By: Lajean Manes M.D. On: 11/30/2021 09:09  MM LT BREAST BX W LOC DEV EA AD LESION IMG BX SPEC STEREO GUIDE  Addendum Date: 12/06/2021   ADDENDUM REPORT: 12/06/2021 07:32 ADDENDUM: Pathology revealed FIBROADENOMATOID CHANGE WITH DYSTROPHIC CALCIFICATIONS of the LEFT breast, upper outer quadrant, (coil clip). This was found to be concordant by Dr. Lajean Manes. Pathology revealed HIGH GRADE DUCTAL CARCINOMA IN SITU WITH CALCIFICATIONS AND NECROSIS of the LEFT breast, retroareolar, (ribbon clip). This was found to be concordant by Dr. Lajean Manes. Pathology results were discussed with the patient by telephone. The patient reported doing well after the biopsies with tenderness and redness at the sites. Post biopsy  instructions and care were reviewed and questions were answered. The patient was encouraged to call [facility] for any additional concerns. My direct phone number was provided. Surgical consultation has been arranged with Dr. Rolm Bookbinder, per patient request, at [facility] on December 20, 2021. Pathology results reported by Terie Purser, RN on 12/04/2021. Electronically Signed   By: Lajean Manes M.D.   On: 12/06/2021 07:32   Result Date: 12/06/2021 CLINICAL DATA:  Patient presents for stereotactic core needle biopsy of 2 groups of left breast calcifications. EXAM: LEFT BREAST  STEREOTACTIC CORE NEEDLE BIOPSY: 2 BIOPSIES PERFORMED COMPARISON:  Previous exams. FINDINGS: The patient and I discussed the procedure of stereotactic-guided biopsy including benefits and alternatives. We discussed the high likelihood of a successful procedure. We discussed the risks of the procedure including infection, bleeding, tissue injury, clip migration, and inadequate sampling. Informed written consent was given. The usual time out protocol was performed immediately prior to the procedure. Biopsy #1: 9 mm group of calcifications, upper outer quadrant. Using sterile technique and 1% Lidocaine as local anesthetic, under stereotactic guidance, a 9 gauge vacuum assisted device was used to perform core needle biopsy of calcifications in the upper outer quadrant the left breast using a superior approach. Specimen radiograph was performed showing multiple calcifications for which biopsy was performed. Specimens with calcifications are identified for pathology. Lesion quadrant: Upper outer quadrant At the conclusion of the procedure, a coil shaped tissue marker clip was deployed into the biopsy cavity. Biopsy #2: 5 mm group of retroareolar calcifications. Using sterile technique and 1% Lidocaine as local anesthetic, under stereotactic guidance, a 9 gauge vacuum assisted device was used to perform core needle biopsy of calcifications in the retroareolar left breast using a superior approach. Specimen radiograph was performed showing multiple calcifications for which biopsy was performed. Specimens with calcifications are identified for pathology. Lesion quadrant: Upper inner quadrant: Retroareolar. At the conclusion of the procedure, a ribbon shaped tissue marker clip was deployed into the biopsy cavity. Follow-up 2-view mammogram was performed and dictated separately. IMPRESSION: Stereotactic-guided biopsy of 2 groups of left breast calcifications. No apparent complications. Electronically Signed: By: Lajean Manes  M.D. On: 11/30/2021 09:09     IMPRESSION/PLAN: Left breast DCIS, high grade with limited hormonal receptor sensitivity   It was a pleasure meeting the patient today. We discussed the risks, benefits, and side effects of radiotherapy. I recommend radiotherapy to the left breast to reduce her risk of locoregional recurrence.  We discussed that radiation would take approximately 4 weeks to complete and that I would give the patient a few weeks to heal following surgery before starting treatment planning.   We spoke about acute effects including skin irritation and fatigue as well as much less common late effects including internal organ injury or irritation. We spoke about the latest technology that is used to minimize the risk of late effects for patients undergoing radiotherapy to the breast or chest wall. No guarantees of treatment were given. The patient is enthusiastic about proceeding with treatment. I look forward to participating in the patient's care.  I will await her referral back to me for postoperative follow-up and eventual CT simulation/treatment planning.  On date of service, in total, I spent 50 minutes on this encounter. Patient was seen in person.   __________________________________________   Eppie Gibson, MD  This document serves as a record of services personally performed by Eppie Gibson, MD. It was created on her behalf by [facility]  Mare Ferrari, a trained medical scribe. The creation of this record is based on the scribe's personal observations and the provider's statements to them. This document has been checked and approved by the attending provider.

## 2021-12-28 ENCOUNTER — Ambulatory Visit
Admission: RE | Admit: 2021-12-28 | Discharge: 2021-12-28 | Disposition: A | Discharge status: Home / Self Care | Payer: BC Managed Care – PPO | Source: Ambulatory Visit | Attending: Radiation Oncology | Admitting: Radiation Oncology

## 2021-12-28 ENCOUNTER — Encounter: Payer: Self-pay | Admitting: Radiation Oncology

## 2021-12-28 ENCOUNTER — Other Ambulatory Visit: Payer: Self-pay

## 2021-12-28 VITALS — BP 145/69 | HR 71 | Resp 17 | Wt 108.4 lb

## 2021-12-28 DIAGNOSIS — I10 Essential (primary) hypertension: Secondary | ICD-10-CM | POA: Insufficient documentation

## 2021-12-28 DIAGNOSIS — Z809 Family history of malignant neoplasm, unspecified: Secondary | ICD-10-CM | POA: Diagnosis not present

## 2021-12-28 DIAGNOSIS — D0512 Intraductal carcinoma in situ of left breast: Secondary | ICD-10-CM | POA: Insufficient documentation

## 2021-12-28 DIAGNOSIS — Z7984 Long term (current) use of oral hypoglycemic drugs: Secondary | ICD-10-CM | POA: Diagnosis not present

## 2021-12-28 DIAGNOSIS — M810 Age-related osteoporosis without current pathological fracture: Secondary | ICD-10-CM | POA: Insufficient documentation

## 2021-12-28 DIAGNOSIS — E119 Type 2 diabetes mellitus without complications: Secondary | ICD-10-CM | POA: Insufficient documentation

## 2021-12-28 DIAGNOSIS — E7800 Pure hypercholesterolemia, unspecified: Secondary | ICD-10-CM | POA: Diagnosis not present

## 2021-12-28 DIAGNOSIS — Z79899 Other long term (current) drug therapy: Secondary | ICD-10-CM | POA: Diagnosis not present

## 2021-12-28 DIAGNOSIS — Z923 Personal history of irradiation: Secondary | ICD-10-CM | POA: Insufficient documentation

## 2021-12-28 DIAGNOSIS — Z170 Estrogen receptor positive status [ER+]: Secondary | ICD-10-CM | POA: Diagnosis not present

## 2022-01-03 NOTE — Progress Notes (Signed)
[facility] ?CONSULT NOTE ? ?Patient Care Team: ?Prince Solian, MD as PCP - General (Internal Medicine) ? ?CHIEF COMPLAINTS/PURPOSE OF CONSULTATION:  ?Newly diagnosed breast cancer ? ?HISTORY OF PRESENTING ILLNESS:  ?Andrea [name] 66 y.o. female is here because of recent diagnosis of left breast DCIS.  She had a routine screening mammogram that detected asymmetry and calcifications in the left breast.  There were 2 groups.  Behind the nipple there was a 5 mm group of calcifications which on biopsy came back as high-grade DCIS that was ER/PR positive.  The second group of 9 mm calcifications in the upper outer quadrant left breast came back as fibroadenoma.  She was evaluated by surgery and she was referred to [place] for discussion regarding multidisciplinary care and treatment options.   ? ?I reviewed her records extensively and collaborated the history with the patient. ? ?SUMMARY OF ONCOLOGIC HISTORY: ?Oncology History  ?Ductal carcinoma in situ (DCIS) of left breast  ?2010 Miscellaneous  ? Prior history of right breast cancer treated with lumpectomy, radiation and tamoxifen for 5 years ?  ?11/30/2021 Initial Diagnosis  ? Screening mammogram detected left breast asymmetry and calcifications, on diagnostic mammogram: Indeterminate 5 mm group of calcifications retroareolar left breast (DCIS high-grade ER 20%, PR 2%, indeterminate 9 mm calcifications UOQ left breast (fibroadenoma with dystrophic calcs), ?  ? ? ? ?MEDICAL HISTORY:  ?Past Medical History:  ?Diagnosis Date  ? Cancer [facility]) 2005  ? breast cancer, right  ? Diabetes mellitus without complication ([place])   ? Hypercholesteremia   ? Hypertension   ? Osteoporosis   ? Personal history of radiation therapy 2005  ? Right Breast Cancer  ? Urinary incontinence   ? ? ?SURGICAL HISTORY: ?Past Surgical History:  ?Procedure Laterality Date  ? BREAST LUMPECTOMY Right 2005  ? BREAST SURGERY Right 2005  ? lumpectomy  ? CESAREAN SECTION  1991  ? ? ?SOCIAL  HISTORY: ?Social History  ? ?Socioeconomic History  ? Marital status: Divorced  ?  Spouse name: Not on file  ? Number of children: Not on file  ? Years of education: Not on file  ? Highest education level: Not on file  ?Occupational History  ? Not on file  ?Tobacco Use  ? Smoking status: Never  ? Smokeless tobacco: Never  ?Vaping Use  ? Vaping Use: Never used  ?Substance and Sexual Activity  ? Alcohol use: No  ?  Alcohol/week: 0.0 standard drinks  ? Drug use: No  ? Sexual activity: Not Currently  ?  Partners: Male  ?  Birth control/protection: Post-menopausal, Abstinence  ?Other Topics Concern  ? Not on file  ?Social History Narrative  ? Not on file  ? ?Social Determinants of Health  ? ?Financial Resource Strain: Not on file  ?Food Insecurity: Not on file  ?Transportation Needs: Not on file  ?Physical Activity: Not on file  ?Stress: Not on file  ?Social Connections: Not on file  ?Intimate Partner Violence: Not on file  ? ? ?FAMILY HISTORY: ?Family History  ?Problem Relation Age of Onset  ? Heart disease Mother   ? Diabetes Father   ? Breast cancer Sister 53  ?     bilateral breast cancer  ? Cancer Sister   ? Hypertension Brother   ? ? ?ALLERGIES:  has no active allergies. ? ?MEDICATIONS:  ?Current Outpatient Medications  ?Medication Sig Dispense Refill  ? amLODipine (NORVASC) 10 MG tablet Take 1 tablet by mouth daily.    ? Cholecalciferol (VITAMIN  D PO) Take 2,000 Int'l Units by mouth.    ? Cyanocobalamin (VITAMIN B 12) 500 MCG TABS Take 1 tablet by mouth daily.    ? ezetimibe (ZETIA) 10 MG tablet Take 10 mg by mouth daily.    ? metFORMIN (GLUCOPHAGE) 500 MG tablet TAKE 1 TABLET BY MOUTH DAILY WITH LARGEST MEAL OF THE DAY  3  ? TOPROL XL 100 MG 24 hr tablet Take 1 tablet by mouth daily.    ? ?No current facility-administered medications for this visit.  ? ? ?REVIEW OF SYSTEMS:   ?Constitutional: Denies fevers, chills or abnormal night sweats ?All other systems were reviewed with the patient and are  negative. ? ?PHYSICAL EXAMINATION: ?ECOG PERFORMANCE STATUS: 0 - Asymptomatic ? ?Vitals:  ? 01/07/22 1301  ?BP: 140/61  ?Pulse: 74  ?Resp: 18  ?Temp: 97.7 ?F (36.5 ?C)  ?SpO2: 98%  ? ?Filed Weights  ? 01/07/22 1301  ?Weight: 108 lb 6.4 oz (49.2 kg)  ? ? ? ?LABORATORY DATA:  ?I have reviewed the data as listed ?Lab Results  ?Component Value Date  ? WBC 6.0 10/31/2009  ? HGB 14.8 10/31/2009  ? HCT 42.6 10/31/2009  ? MCV 92.7 10/31/2009  ? PLT 245 10/31/2009  ? ?Lab Results  ?Component Value Date  ? NA 141 10/31/2009  ? K 4.0 10/31/2009  ? CL 104 10/31/2009  ? CO2 27 10/31/2009  ? ? ?RADIOGRAPHIC STUDIES: ?I have personally reviewed the radiological reports and agreed with the findings in the report. ? ?ASSESSMENT AND PLAN:  ?Ductal carcinoma in situ (DCIS) of left breast ?Screening mammogram detected left breast asymmetry and calcifications, on diagnostic mammogram: Indeterminate 5 mm group of calcifications retroareolar left breast (DCIS high-grade ER 20%, PR 2%, indeterminate 9 mm calcifications UOQ left breast (fibroadenoma with dystrophic calcs) ? ?2010: History of right breast cancer treated with lumpectomy, radiation and antiestrogen therapy with tamoxifen x5 years ? ?Pathology review: I discussed with the patient the difference between DCIS and invasive breast cancer. It is considered a precancerous lesion. DCIS is classified as a 0. It is generally detected through mammograms as calcifications. We discussed the significance of grades and its impact on prognosis. We also discussed the importance of ER and PR receptors and their implications to adjuvant treatment options. Prognosis of DCIS dependence on grade, comedo necrosis. It is anticipated that if not treated, 20-30% of DCIS can develop into invasive breast cancer. ? ?Recommendation: ?1. Breast conserving surgery ?2. Followed by adjuvant radiation therapy ?3. Followed by antiestrogen therapy with tamoxifen ?5 years versus anastrozole x5 years ? ?Return to  clinic after surgery to discuss the final pathology report and come up with an adjuvant treatment plan. ? ? ? ? ?All questions were answered. The patient knows to call the clinic with any problems, questions or concerns. ?  ? Harriette Ohara, MD ?01/07/22 ? ?

## 2022-01-07 ENCOUNTER — Other Ambulatory Visit: Payer: Self-pay

## 2022-01-07 ENCOUNTER — Inpatient Hospital Stay: Payer: BC Managed Care – PPO | Attending: Hematology and Oncology | Admitting: Hematology and Oncology

## 2022-01-07 ENCOUNTER — Inpatient Hospital Stay: Payer: BC Managed Care – PPO

## 2022-01-07 DIAGNOSIS — D0512 Intraductal carcinoma in situ of left breast: Secondary | ICD-10-CM | POA: Diagnosis not present

## 2022-01-07 DIAGNOSIS — I10 Essential (primary) hypertension: Secondary | ICD-10-CM | POA: Insufficient documentation

## 2022-01-07 DIAGNOSIS — Z923 Personal history of irradiation: Secondary | ICD-10-CM | POA: Insufficient documentation

## 2022-01-07 DIAGNOSIS — Z79899 Other long term (current) drug therapy: Secondary | ICD-10-CM | POA: Insufficient documentation

## 2022-01-07 DIAGNOSIS — M810 Age-related osteoporosis without current pathological fracture: Secondary | ICD-10-CM | POA: Insufficient documentation

## 2022-01-07 DIAGNOSIS — Z170 Estrogen receptor positive status [ER+]: Secondary | ICD-10-CM | POA: Insufficient documentation

## 2022-01-07 DIAGNOSIS — Z803 Family history of malignant neoplasm of breast: Secondary | ICD-10-CM | POA: Insufficient documentation

## 2022-01-07 NOTE — Research (Signed)
MTG-015 - Tissue and Bodily Fluids: Translational Medicine: Discovery and Evaluation of Biomarkers/Pharmacogenomics for the Diagnosis and Personalized Management of Patients  ? ?Rec'd referral from Dr Lindi Adie. Upon chart review, pt is ineligible due to history of cancer in the opposite breast. ? ?Marjie Skiff Serayah Yazdani, RN, BSN, CHPN ?She  [facility]  [facility] ?Clinical Research Nurse ?[place] ?Direct Dial [phone]  Pager [phone] ?01/07/2022 2:12 PM ?

## 2022-01-07 NOTE — Assessment & Plan Note (Signed)
Screening mammogram detected left breast asymmetry and calcifications, on diagnostic mammogram: Indeterminate 5 mm group of calcifications retroareolar left breast (DCIS high-grade ER 20%, PR 2%, indeterminate 9 mm calcifications UOQ left breast (fibroadenoma with dystrophic calcs), ? ?Pathology review: I discussed with the patient the difference between DCIS and invasive breast cancer. It is considered a precancerous lesion. DCIS is classified as a 0. It is generally detected through mammograms as calcifications. We discussed the significance of grades and its impact on prognosis. We also discussed the importance of ER and PR receptors and their implications to adjuvant treatment options. Prognosis of DCIS dependence on grade, comedo necrosis. It is anticipated that if not treated, 20-30% of DCIS can develop into invasive breast cancer. ? ?Recommendation: ?1. Breast conserving surgery ?2. Followed by adjuvant radiation therapy ?3. Followed by antiestrogen therapy with tamoxifen ?5 years ? ?Tamoxifen counseling: We discussed the risks and benefits of tamoxifen. These include but not limited to insomnia, hot flashes, mood changes, vaginal dryness, and weight gain. Although rare, serious side effects including endometrial cancer, risk of blood clots were also discussed. We strongly believe that the benefits far outweigh the risks. Patient understands these risks and consented to starting treatment. Planned treatment duration is 5 years. ? ?Return to clinic after surgery to discuss the final pathology report and come up with an adjuvant treatment plan. ? ? ? ?

## 2022-01-08 ENCOUNTER — Encounter (HOSPITAL_BASED_OUTPATIENT_CLINIC_OR_DEPARTMENT_OTHER): Payer: Self-pay | Admitting: General Surgery

## 2022-01-08 ENCOUNTER — Other Ambulatory Visit: Payer: Self-pay

## 2022-01-08 ENCOUNTER — Telehealth: Payer: Self-pay | Admitting: Radiation Oncology

## 2022-01-08 ENCOUNTER — Encounter: Payer: Self-pay | Admitting: *Deleted

## 2022-01-08 DIAGNOSIS — D0512 Intraductal carcinoma in situ of left breast: Secondary | ICD-10-CM

## 2022-01-08 NOTE — Telephone Encounter (Signed)
Called patient to schedule a consultation w. Dr. Squire. No answer, LVM for a return call.  

## 2022-01-09 ENCOUNTER — Telehealth: Payer: Self-pay | Admitting: Radiation Oncology

## 2022-01-09 NOTE — Telephone Encounter (Signed)
Called patient to schedule a follow up appointment w. Dr. Squire. No answer, LVM for a return call. 

## 2022-01-10 ENCOUNTER — Telehealth: Payer: Self-pay | Admitting: Radiation Oncology

## 2022-01-10 NOTE — Telephone Encounter (Signed)
Sent unable to contact letter 01/10/22.  ?

## 2022-01-10 NOTE — Telephone Encounter (Signed)
Called patient to schedule a consultation w. Dr. Squire. No answer, LVM for a return call.  

## 2022-01-11 ENCOUNTER — Encounter (HOSPITAL_BASED_OUTPATIENT_CLINIC_OR_DEPARTMENT_OTHER)
Admission: RE | Admit: 2022-01-11 | Discharge: 2022-01-11 | Disposition: A | Discharge status: Home / Self Care | Payer: BC Managed Care – PPO | Source: Ambulatory Visit | Attending: General Surgery | Admitting: General Surgery

## 2022-01-11 ENCOUNTER — Telehealth: Payer: Self-pay | Admitting: Hematology and Oncology

## 2022-01-11 DIAGNOSIS — Z01812 Encounter for preprocedural laboratory examination: Secondary | ICD-10-CM | POA: Insufficient documentation

## 2022-01-11 LAB — BASIC METABOLIC PANEL
Anion gap: 11 (ref 5–15)
BUN: 11 mg/dL (ref 8–23)
CO2: 25 mmol/L (ref 22–32)
Calcium: 9.3 mg/dL (ref 8.9–10.3)
Chloride: 101 mmol/L (ref 98–111)
Creatinine, Ser: 0.46 mg/dL (ref 0.44–1.00)
GFR, Estimated: >60 mL/min (ref >60)
Glucose, Bld: 191 mg/dL — ABNORMAL HIGH (ref 70–99)
Potassium: 4.2 mmol/L (ref 3.5–5.1)
Sodium: 137 mmol/L (ref 135–145)

## 2022-01-11 MED ORDER — CHLORHEXIDINE GLUCONATE CLOTH 2 % EX PADS: 6.0000 | MEDICATED_PAD | Freq: Once | CUTANEOUS | Status: DC

## 2022-01-11 NOTE — Progress Notes (Signed)
? ? ? ? ?  Enhanced Recovery after Surgery for Orthopedics ?Enhanced Recovery after Surgery is a protocol used to improve the stress on your body and your recovery after surgery. ? ?Patient Instructions ? ?The night before surgery:  ?No food after midnight. ONLY clear liquids after midnight ? ?The day of surgery (if you do NOT have diabetes):  ?Drink ONE (1) Pre-Surgery Clear Ensure as directed.   ?This drink was given to you during your hospital  ?pre-op appointment visit. ?The pre-op nurse will instruct you on the time to drink the  ?Pre-Surgery Ensure depending on your surgery time. ?Finish the drink at the designated time by the pre-op nurse.  ?Nothing else to drink after completing the  ?Pre-Surgery Clear Ensure. ? ?The day of surgery (if you have diabetes): ?Drink ONE (1) Gatorade 2 (G2) as directed. ?This drink was given to you during your hospital  ?pre-op appointment visit.  ?The pre-op nurse will instruct you on the time to drink the  ? Gatorade 2 (G2) depending on your surgery time. ?Color of the Gatorade may vary. Red is not allowed. ?Nothing else to drink after completing the  ?Gatorade 2 (G2). ? ?       If you have questions, please contact your surgeon?s office. ? ? ? ?Surgical soap given to patient, verbalizes understanding. ?

## 2022-01-11 NOTE — Telephone Encounter (Signed)
Scheduled appointment per 3/13 los. Left message. Patient will be mailed an updated calendar. ?

## 2022-01-16 ENCOUNTER — Ambulatory Visit
Admission: RE | Admit: 2022-01-16 | Discharge: 2022-01-16 | Disposition: A | Discharge status: Home / Self Care | Payer: BC Managed Care – PPO | Source: Ambulatory Visit | Attending: General Surgery | Admitting: General Surgery

## 2022-01-16 ENCOUNTER — Other Ambulatory Visit: Payer: Self-pay

## 2022-01-16 DIAGNOSIS — D0512 Intraductal carcinoma in situ of left breast: Secondary | ICD-10-CM

## 2022-01-17 ENCOUNTER — Ambulatory Visit
Admission: RE | Admit: 2022-01-17 | Discharge: 2022-01-17 | Disposition: A | Discharge status: Home / Self Care | Payer: BC Managed Care – PPO | Source: Ambulatory Visit | Attending: General Surgery | Admitting: General Surgery

## 2022-01-17 ENCOUNTER — Encounter (HOSPITAL_BASED_OUTPATIENT_CLINIC_OR_DEPARTMENT_OTHER)
Admission: RE | Disposition: A | Discharge status: Home / Self Care | Payer: Self-pay | Source: Home / Self Care | Attending: General Surgery

## 2022-01-17 ENCOUNTER — Other Ambulatory Visit: Payer: Self-pay

## 2022-01-17 ENCOUNTER — Ambulatory Visit (HOSPITAL_BASED_OUTPATIENT_CLINIC_OR_DEPARTMENT_OTHER)
Admission: RE | Admit: 2022-01-17 | Discharge: 2022-01-17 | Disposition: A | Discharge status: Home / Self Care | Payer: BC Managed Care – PPO | Attending: General Surgery | Admitting: General Surgery

## 2022-01-17 ENCOUNTER — Ambulatory Visit (HOSPITAL_BASED_OUTPATIENT_CLINIC_OR_DEPARTMENT_OTHER): Payer: BC Managed Care – PPO | Admitting: Anesthesiology

## 2022-01-17 ENCOUNTER — Encounter (HOSPITAL_BASED_OUTPATIENT_CLINIC_OR_DEPARTMENT_OTHER): Payer: Self-pay | Admitting: General Surgery

## 2022-01-17 DIAGNOSIS — Z923 Personal history of irradiation: Secondary | ICD-10-CM | POA: Insufficient documentation

## 2022-01-17 DIAGNOSIS — N6022 Fibroadenosis of left breast: Secondary | ICD-10-CM | POA: Insufficient documentation

## 2022-01-17 DIAGNOSIS — Z803 Family history of malignant neoplasm of breast: Secondary | ICD-10-CM | POA: Diagnosis not present

## 2022-01-17 DIAGNOSIS — Z853 Personal history of malignant neoplasm of breast: Secondary | ICD-10-CM | POA: Diagnosis not present

## 2022-01-17 DIAGNOSIS — E119 Type 2 diabetes mellitus without complications: Secondary | ICD-10-CM | POA: Insufficient documentation

## 2022-01-17 DIAGNOSIS — R921 Mammographic calcification found on diagnostic imaging of breast: Secondary | ICD-10-CM | POA: Diagnosis not present

## 2022-01-17 DIAGNOSIS — N62 Hypertrophy of breast: Secondary | ICD-10-CM | POA: Diagnosis not present

## 2022-01-17 DIAGNOSIS — Z7984 Long term (current) use of oral hypoglycemic drugs: Secondary | ICD-10-CM | POA: Diagnosis not present

## 2022-01-17 DIAGNOSIS — I10 Essential (primary) hypertension: Secondary | ICD-10-CM | POA: Insufficient documentation

## 2022-01-17 DIAGNOSIS — N6011 Diffuse cystic mastopathy of right breast: Secondary | ICD-10-CM | POA: Diagnosis not present

## 2022-01-17 DIAGNOSIS — D0512 Intraductal carcinoma in situ of left breast: Secondary | ICD-10-CM | POA: Diagnosis not present

## 2022-01-17 HISTORY — PX: BREAST LUMPECTOMY WITH RADIOACTIVE SEED LOCALIZATION: SHX6424

## 2022-01-17 HISTORY — DX: Gastro-esophageal reflux disease without esophagitis: K21.9

## 2022-01-17 LAB — GLUCOSE, CAPILLARY: Glucose-Capillary: 195 mg/dL — ABNORMAL HIGH (ref 70–99)

## 2022-01-17 SURGERY — BREAST LUMPECTOMY WITH RADIOACTIVE SEED LOCALIZATION
Anesthesia: General | Site: Breast | Laterality: Left

## 2022-01-17 MED ORDER — ACETAMINOPHEN 160 MG/5ML PO SOLN: 325.0000 mg | ORAL | Status: DC | PRN

## 2022-01-17 MED ORDER — OXYCODONE HCL 5 MG/5ML PO SOLN: 5.0000 mg | Freq: Once | ORAL | Status: DC | PRN

## 2022-01-17 MED ORDER — FENTANYL CITRATE (PF) 100 MCG/2ML IJ SOLN
INTRAMUSCULAR | Status: DC | PRN
  Administered 2022-01-17: 50 ug via INTRAVENOUS

## 2022-01-17 MED ORDER — ACETAMINOPHEN 500 MG PO TABS
ORAL_TABLET | ORAL | Status: AC
  Filled 2022-01-17: qty 2

## 2022-01-17 MED ORDER — ONDANSETRON HCL 4 MG/2ML IJ SOLN
INTRAMUSCULAR | Status: DC | PRN
  Administered 2022-01-17: 4 mg via INTRAVENOUS

## 2022-01-17 MED ORDER — PROPOFOL 10 MG/ML IV BOLUS
INTRAVENOUS | Status: DC | PRN
  Administered 2022-01-17: 120 mg via INTRAVENOUS

## 2022-01-17 MED ORDER — CEFAZOLIN SODIUM-DEXTROSE 2-4 GM/100ML-% IV SOLN
INTRAVENOUS | Status: AC
  Filled 2022-01-17: qty 100

## 2022-01-17 MED ORDER — LACTATED RINGERS IV SOLN: INTRAVENOUS | Status: DC

## 2022-01-17 MED ORDER — ACETAMINOPHEN 325 MG PO TABS: 325.0000 mg | ORAL_TABLET | ORAL | Status: DC | PRN

## 2022-01-17 MED ORDER — OXYCODONE HCL 5 MG PO TABS: 5.0000 mg | ORAL_TABLET | Freq: Once | ORAL | Status: DC | PRN

## 2022-01-17 MED ORDER — DEXAMETHASONE SODIUM PHOSPHATE 10 MG/ML IJ SOLN
INTRAMUSCULAR | Status: DC | PRN
  Administered 2022-01-17: 5 mg via INTRAVENOUS

## 2022-01-17 MED ORDER — ONDANSETRON HCL 4 MG/2ML IJ SOLN: 4.0000 mg | Freq: Once | INTRAMUSCULAR | Status: DC | PRN

## 2022-01-17 MED ORDER — EPHEDRINE 5 MG/ML INJ
INTRAVENOUS | Status: AC
  Filled 2022-01-17: qty 5

## 2022-01-17 MED ORDER — FENTANYL CITRATE (PF) 100 MCG/2ML IJ SOLN
INTRAMUSCULAR | Status: AC
Start: 2022-01-17 — End: ?
  Filled 2022-01-17: qty 2

## 2022-01-17 MED ORDER — BUPIVACAINE HCL (PF) 0.25 % IJ SOLN
INTRAMUSCULAR | Status: DC | PRN
  Administered 2022-01-17: 10 mL

## 2022-01-17 MED ORDER — FENTANYL CITRATE (PF) 100 MCG/2ML IJ SOLN: 25.0000 ug | INTRAMUSCULAR | Status: DC | PRN

## 2022-01-17 MED ORDER — EPHEDRINE SULFATE (PRESSORS) 50 MG/ML IJ SOLN
INTRAMUSCULAR | Status: DC | PRN
  Administered 2022-01-17: 7 mg via INTRAVENOUS

## 2022-01-17 MED ORDER — MIDAZOLAM HCL 2 MG/2ML IJ SOLN
INTRAMUSCULAR | Status: AC
  Filled 2022-01-17: qty 2

## 2022-01-17 MED ORDER — PROPOFOL 10 MG/ML IV BOLUS
INTRAVENOUS | Status: AC
  Filled 2022-01-17: qty 20

## 2022-01-17 MED ORDER — MIDAZOLAM HCL 5 MG/5ML IJ SOLN
INTRAMUSCULAR | Status: DC | PRN
  Administered 2022-01-17 (×2): 1 mg via INTRAVENOUS

## 2022-01-17 MED ORDER — MEPERIDINE HCL 25 MG/ML IJ SOLN: 6.2500 mg | INTRAMUSCULAR | Status: DC | PRN

## 2022-01-17 MED ORDER — DEXAMETHASONE SODIUM PHOSPHATE 10 MG/ML IJ SOLN
INTRAMUSCULAR | Status: AC
  Filled 2022-01-17: qty 1

## 2022-01-17 MED ORDER — ACETAMINOPHEN 500 MG PO TABS
1000.0000 mg | ORAL_TABLET | ORAL | Status: AC
  Administered 2022-01-17: 500 mg via ORAL

## 2022-01-17 MED ORDER — ONDANSETRON HCL 4 MG/2ML IJ SOLN
INTRAMUSCULAR | Status: AC
  Filled 2022-01-17: qty 2

## 2022-01-17 MED ORDER — CEFAZOLIN SODIUM-DEXTROSE 2-4 GM/100ML-% IV SOLN
2.0000 g | INTRAVENOUS | Status: AC
  Administered 2022-01-17: 2 g via INTRAVENOUS

## 2022-01-17 MED ORDER — ENSURE PRE-SURGERY PO LIQD: 296.0000 mL | Freq: Once | ORAL | Status: DC

## 2022-01-17 MED ORDER — LIDOCAINE HCL 1 % IJ SOLN
INTRAMUSCULAR | Status: DC | PRN
  Administered 2022-01-17: 60 mg via INTRADERMAL

## 2022-01-17 SURGICAL SUPPLY — 57 items
ADH SKN CLS APL DERMABOND .7 (GAUZE/BANDAGES/DRESSINGS) ×1
APL PRP STRL LF DISP 70% ISPRP (MISCELLANEOUS) ×1
APPLIER CLIP 9.375 MED OPEN (MISCELLANEOUS)
APR CLP MED 9.3 20 MLT OPN (MISCELLANEOUS)
BINDER BREAST LRG (GAUZE/BANDAGES/DRESSINGS) IMPLANT
BINDER BREAST MEDIUM (GAUZE/BANDAGES/DRESSINGS) ×1 IMPLANT
BINDER BREAST XLRG (GAUZE/BANDAGES/DRESSINGS) IMPLANT
BINDER BREAST XXLRG (GAUZE/BANDAGES/DRESSINGS) IMPLANT
BLADE SURG 15 STRL LF DISP TIS (BLADE) ×1 IMPLANT
BLADE SURG 15 STRL SS (BLADE) ×2
CANISTER SUC SOCK COL 7IN (MISCELLANEOUS) IMPLANT
CANISTER SUCT 1200ML W/VALVE (MISCELLANEOUS) IMPLANT
CHLORAPREP W/TINT 26 (MISCELLANEOUS) ×2 IMPLANT
CLIP APPLIE 9.375 MED OPEN (MISCELLANEOUS) IMPLANT
CLIP TI WIDE RED SMALL 6 (CLIP) IMPLANT
COVER BACK TABLE 60X90IN (DRAPES) ×3 IMPLANT
COVER MAYO STAND STRL (DRAPES) ×2 IMPLANT
COVER PROBE W GEL 5X96 (DRAPES) ×3 IMPLANT
DERMABOND ADVANCED (GAUZE/BANDAGES/DRESSINGS) ×1
DERMABOND ADVANCED .7 DNX12 (GAUZE/BANDAGES/DRESSINGS) ×2 IMPLANT
DRAPE LAPAROSCOPIC ABDOMINAL (DRAPES) ×3 IMPLANT
DRAPE UTILITY XL STRL (DRAPES) ×2 IMPLANT
DRSG TEGADERM 4X4.75 (GAUZE/BANDAGES/DRESSINGS) ×1 IMPLANT
ELECT COATED BLADE 2.86 ST (ELECTRODE) ×3 IMPLANT
ELECT REM PT RETURN 9FT ADLT (ELECTROSURGICAL) ×2
ELECTRODE REM PT RTRN 9FT ADLT (ELECTROSURGICAL) ×2 IMPLANT
GAUZE SPONGE 4X4 12PLY STRL LF (GAUZE/BANDAGES/DRESSINGS) ×1 IMPLANT
GLOVE SURG ENC MOIS LTX SZ7 (GLOVE) ×6 IMPLANT
GLOVE SURG UNDER POLY LF SZ7 (GLOVE) ×2 IMPLANT
GLOVE SURG UNDER POLY LF SZ7.5 (GLOVE) ×2 IMPLANT
GOWN STRL REUS W/ TWL LRG LVL3 (GOWN DISPOSABLE) ×2 IMPLANT
GOWN STRL REUS W/TWL LRG LVL3 (GOWN DISPOSABLE) ×8
HEMOSTAT ARISTA ABSORB 3G PWDR (HEMOSTASIS) IMPLANT
KIT MARKER MARGIN INK (KITS) ×3 IMPLANT
NDL HYPO 25X1 1.5 SAFETY (NEEDLE) ×1 IMPLANT
NEEDLE HYPO 25X1 1.5 SAFETY (NEEDLE) ×2 IMPLANT
NS IRRIG 1000ML POUR BTL (IV SOLUTION) IMPLANT
PACK BASIN DAY SURGERY FS (CUSTOM PROCEDURE TRAY) ×2 IMPLANT
PENCIL SMOKE EVACUATOR (MISCELLANEOUS) ×3 IMPLANT
RETRACTOR ONETRAX LX 90X20 (MISCELLANEOUS) IMPLANT
SLEEVE SCD COMPRESS KNEE MED (STOCKING) ×3 IMPLANT
SPIKE FLUID TRANSFER (MISCELLANEOUS) IMPLANT
SPONGE T-LAP 4X18 ~~LOC~~+RFID (SPONGE) ×3 IMPLANT
STRIP CLOSURE SKIN 1/2X4 (GAUZE/BANDAGES/DRESSINGS) ×3 IMPLANT
SUT MNCRL AB 4-0 PS2 18 (SUTURE) ×3 IMPLANT
SUT MON AB 5-0 PS2 18 (SUTURE) IMPLANT
SUT SILK 2 0 SH (SUTURE) ×1 IMPLANT
SUT VIC AB 2-0 SH 27 (SUTURE) ×4
SUT VIC AB 2-0 SH 27XBRD (SUTURE) ×1 IMPLANT
SUT VIC AB 3-0 SH 27 (SUTURE) ×2
SUT VIC AB 3-0 SH 27X BRD (SUTURE) ×2 IMPLANT
SUT VIC AB 5-0 PS2 18 (SUTURE) IMPLANT
SYR CONTROL 10ML LL (SYRINGE) ×3 IMPLANT
TOWEL GREEN STERILE FF (TOWEL DISPOSABLE) ×3 IMPLANT
TRAY FAXITRON CT DISP (TRAY / TRAY PROCEDURE) ×3 IMPLANT
TUBE CONNECTING 20X1/4 (TUBING) IMPLANT
YANKAUER SUCT BULB TIP NO VENT (SUCTIONS) IMPLANT

## 2022-01-17 NOTE — Transfer of Care (Signed)
Immediate Anesthesia Transfer of Care Note ? ?Patient: Andrea [name] ? ?Procedure(s) Performed: LEFT BREAST LUMPECTOMY WITH RADIOACTIVE SEED LOCALIZATION (Left: Breast) ? ?Patient Location: PACU ? ?Anesthesia Type:General ? ?Level of Consciousness: oriented, drowsy and patient cooperative ? ?Airway & Oxygen Therapy: Patient Spontanous Breathing and Patient connected to face mask oxygen ? ?Post-op Assessment: Report given to RN and Post -op Vital signs reviewed and stable ? ?Post vital signs: Reviewed and stable ? ?Last Vitals:  ?Vitals Value Taken Time  ?BP 128/69 01/17/22 1101  ?Temp    ?Pulse 72 01/17/22 1101  ?Resp 23 01/17/22 1101  ?SpO2 100 % 01/17/22 1101  ? ? ?Last Pain:  ?Vitals:  ? 01/17/22 0804  ?TempSrc: Oral  ?PainSc: 0-No pain  ?   ? ?Patients Stated Pain Goal: 3 (01/17/22 0804) ? ?Complications: No notable events documented. ?

## 2022-01-17 NOTE — Anesthesia Postprocedure Evaluation (Signed)
Anesthesia Post Note ? ?Patient: Anna-Marie Coller Fraga ? ?Procedure(s) Performed: LEFT BREAST LUMPECTOMY WITH RADIOACTIVE SEED LOCALIZATION (Left: Breast) ? ?  ? ?Patient location during evaluation: PACU ?Anesthesia Type: General ?Level of consciousness: awake and alert ?Pain management: pain level controlled ?Vital Signs Assessment: post-procedure vital signs reviewed and stable ?Respiratory status: spontaneous breathing, nonlabored ventilation and respiratory function stable ?Cardiovascular status: blood pressure returned to baseline and stable ?Postop Assessment: no apparent nausea or vomiting ?Anesthetic complications: no ? ? ?No notable events documented. ? ?Last Vitals:  ?Vitals:  ? 01/17/22 1101 01/17/22 1115  ?BP: 128/69 120/67  ?Pulse: 72 70  ?Resp: (!) 23 19  ?Temp: 36.6 ?C   ?SpO2: 100% 100%  ?  ?Last Pain:  ?Vitals:  ? 01/17/22 1115  ?TempSrc:   ?PainSc: Asleep  ? ? ?  ?  ?  ?  ?  ?  ? ?Lidia Collum ? ? ? ? ?

## 2022-01-17 NOTE — Anesthesia Procedure Notes (Signed)
Procedure Name: LMA Insertion ?Date/Time: 01/17/2022 10:02 AM ?Performed by: Garrel Ridgel, CRNA ?Pre-anesthesia Checklist: Patient identified, Emergency Drugs available, Suction available and Patient being monitored ?Patient Re-evaluated:Patient Re-evaluated prior to induction ?Oxygen Delivery Method: Circle system utilized ?Preoxygenation: Pre-oxygenation with 100% oxygen ?Induction Type: IV induction ?Ventilation: Mask ventilation without difficulty ?LMA: LMA inserted ?LMA Size: 3.0 ?Number of attempts: 1 ?Placement Confirmation: positive ETCO2 ?Tube secured with: Tape ?Dental Injury: Teeth and Oropharynx as per pre-operative assessment  ? ? ? ? ?

## 2022-01-17 NOTE — Interval H&P Note (Signed)
History and Physical Interval Note: ? ?01/17/2022 ?9:26 AM ? ?Andrea [name]  has presented today for surgery, with the diagnosis of LEFT BREAST DCIS.  The various methods of treatment have been discussed with the patient and family. After consideration of risks, benefits and other options for treatment, the patient has consented to  Procedure(s): ?LEFT BREAST LUMPECTOMY WITH RADIOACTIVE SEED LOCALIZATION (Left) as a surgical intervention.  The patient's history has been reviewed, patient examined, no change in status, stable for surgery.  I have reviewed the patient's chart and labs.  Questions were answered to the patient's satisfaction.   ? ? ?Rolm Bookbinder ? ? ?

## 2022-01-17 NOTE — H&P (Signed)
?66 y.o. female who is seen today for left breast DCIS. She has a history in 2005 of what sounds like an ER positive invasive ductal carcinoma that was treated with right breast lumpectomy, sentinel node biopsy, radiation, and tamoxifen for 5 years. She has genetic testing in 2015 that is negative. She has a family history of bilateral breast cancer in her sister. She was noted on screening mammogram to be density breast. She was noted to have a couple area of calcifications. She had a 5 mm retroareolar area and a 9 mm upper outer quadrant area. The upper outer quadrant biopsy is fibroadenomatoid change with dystrophic calcifications that is benign and concordant. The retroareolar area that measured 5 cm is high-grade ductal carcinoma in situ that is 20% ER positive and 2% PR positive. She has no mass or discharge. She is here to discuss her options. ? ? ?Review of Systems: ?A complete review of systems was obtained from the patient. I have reviewed this information and discussed as appropriate with the patient. See HPI as well for other ROS. ? ?Review of Systems  ?All other systems reviewed and are negative. ? ? ?Medical History: ?Past Medical History:  ?Diagnosis Date  ? Diabetes mellitus without complication (CMS-HCC)  ? GERD (gastroesophageal reflux disease)  ? History of cancer  ? Hypertension  ? ?Past Surgical History:  ?Procedure Laterality Date  ? DEEP AXILLARY SENTINEL NODE BIOPSY / EXCISION Right  ? MASTECTOMY PARTIAL / LUMPECTOMY Right  ? ? ?Allergies  ?Allergen Reactions  ? Alendronate Other (See Comments)  ?Joint aches  ? Alogliptin Other (See Comments)  ? Canagliflozin Other (See Comments)  ? Pravastatin Other (See Comments)  ?Joint/leg pain---all statins  ? Telmisartan Other (See Comments)  ?Dizziness, change in gait  ? ?Current Outpatient Medications on File Prior to Visit  ?Medication Sig Dispense Refill  ? amLODIPine (NORVASC) 10 MG tablet Take 10 mg by mouth once daily  ? ezetimibe (ZETIA) 10 mg  tablet  ? metFORMIN (GLUCOPHAGE) 500 MG tablet TAKE 1 TABLET BY MOUTH DAILY WITH LARGEST MEAL OF THE DAY 90  ? ? ?Family History  ?Problem Relation Age of Onset  ? Stroke Father  ? High blood pressure (Hypertension) Father  ? Heart valve disease Father  ? Breast cancer Sister  ? ? ?Social History  ? ?Tobacco Use  ?Smoking Status Never  ?Smokeless Tobacco Never  ? ? ?Social History  ? ?Socioeconomic History  ? Marital status: Divorced  ?Tobacco Use  ? Smoking status: Never  ? Smokeless tobacco: Never  ?Vaping Use  ? Vaping Use: Never used  ?Substance and Sexual Activity  ? Alcohol use: Never  ? Drug use: Never  ? ?Objective:  ? ?Body mass index is 23.8 kg/m?. ? ?Physical Exam ?Constitutional:  ?Appearance: Normal appearance.  ?Chest:  ?Breasts: ?Right: No inverted nipple, mass or nipple discharge.  ?Left: No inverted nipple, mass or nipple discharge.  ?Lymphadenopathy:  ?Upper Body:  ?Right upper body: No supraclavicular or axillary adenopathy.  ?Left upper body: No supraclavicular or axillary adenopathy.  ?Neurological:  ?Mental Status: She is alert.  ? ?Assessment and Plan:  ? ?Ductal carcinoma in situ (DCIS) of left breast ? ? ?Left breast radioactive seed guided lumpectomy ? ?We discussed the staging and pathophysiology of breast cancer. We discussed all of the different options for treatment for breast cancer including surgery, chemotherapy, radiation therapy, Herceptin, and antiestrogen therapy. ?I do not think she requires updated genetic testing. ?She does not require a sentinel  lymph node biopsy based on the core biopsy. ?We discussed the options for treatment of the breast cancer which included lumpectomy versus a mastectomy. We discussed the performance of the lumpectomy with radioactive seed placement. We discussed a 5-10% chance of a positive margin requiring reexcision in the operating room. We also discussed that she will likely need radiation therapy if she undergoes lumpectomy. We discussed  mastectomy and the postoperative care for that as well. Mastectomy can be followed by reconstruction. The decision for lumpectomy vs mastectomy has no impact on decision for chemotherapy. Most mastectomy patients will not need radiation therapy. We discussed that there is no difference in her survival whether she undergoes lumpectomy with radiation therapy or antiestrogen therapy versus a mastectomy. There is also no real difference between her recurrence in the breast. ?We discussed the risks of operation including bleeding, infection, possible reoperation. She understands her further therapy will be based on what her stages at the time of her operation. ?I will refer her to see radiation oncology and medical oncology.  ?

## 2022-01-17 NOTE — Anesthesia Preprocedure Evaluation (Signed)
Anesthesia Evaluation  ?Patient identified by MRN, date of birth, ID band ?Patient awake ? ? ? ?Reviewed: ?Allergy & Precautions, NPO status , Patient's Chart, lab work & pertinent test results ? ?History of Anesthesia Complications ?Negative for: history of anesthetic complications ? ?Airway ?Mallampati: II ? ?TM Distance: >3 FB ?Neck ROM: Full ? ? ? Dental ? ?(+) Partial Upper ?  ?Pulmonary ?neg pulmonary ROS,  ?  ?Pulmonary exam normal ? ? ? ? ? ? ? Cardiovascular ?hypertension, Normal cardiovascular exam ? ? ?  ?Neuro/Psych ?negative neurological ROS ?   ? GI/Hepatic ?Neg liver ROS, GERD  ,  ?Endo/Other  ?diabetes, Oral Hypoglycemic Agents ? Renal/GU ?negative Renal ROS  ?negative genitourinary ?  ?Musculoskeletal ?negative musculoskeletal ROS ?(+)  ? Abdominal ?  ?Peds ? Hematology ?negative hematology ROS ?(+)   ?Anesthesia Other Findings ? ?L breast DCIS ? Reproductive/Obstetrics ? ?  ? ? ? ? ? ? ? ? ? ? ? ? ? ?  ?  ? ? ? ? ? ? ? ?Anesthesia Physical ?Anesthesia Plan ? ?ASA: 2 ? ?Anesthesia Plan: General  ? ?Post-op Pain Management: Tylenol PO (pre-op)*  ? ?Induction: Intravenous ? ?PONV Risk Score and Plan: 3 and Ondansetron, Dexamethasone, Midazolam and Treatment may vary due to age or medical condition ? ?Airway Management Planned: LMA ? ?Additional Equipment: None ? ?Intra-op Plan:  ? ?Post-operative Plan: Extubation in OR ? ?Informed Consent: I have reviewed the patients History and Physical, chart, labs and discussed the procedure including the risks, benefits and alternatives for the proposed anesthesia with the patient or authorized representative who has indicated his/her understanding and acceptance.  ? ? ? ?Dental advisory given ? ?Plan Discussed with:  ? ?Anesthesia Plan Comments:   ? ? ? ? ? ? ?Anesthesia Quick Evaluation ? ?

## 2022-01-17 NOTE — Discharge Instructions (Addendum)
[facility] ?Office Phone Number [phone] ? ?POST OP INSTRUCTIONS ?Take 400 mg of ibuprofen every 8 hours or 650 mg tylenol every 6 hours for next 72 hours then as needed. Use ice several times daily also. ?Tylenol 500 mg was given at 0800 am ? ? You may take acetaminophen (Tylenol), naprosyn (Alleve) or ibuprofen (Advil) as needed. ?Take your usually prescribed medications unless otherwise directed ?You should eat very light the first 24 hours after surgery, such as soup, crackers, pudding, etc.  Resume your normal diet the day after surgery. ?Most patients will experience some swelling and bruising in the breast.  Ice packs and a good support bra will help.  Wear the breast binder provided or a sports bra for 72 hours day and night.  After that wear a sports bra during the day until you return to the office. Swelling and bruising can take several days to resolve.  ?It is common to experience some constipation if taking pain medication after surgery.  Increasing fluid intake and taking a stool softener will usually help or prevent this problem from occurring.  A mild laxative (Milk of Magnesia or Miralax) should be taken according to package directions if there are no bowel movements after 48 hours. ?I used skin glue on the incision, you may shower in 24 hours.  The glue will flake off over the next 2-3 weeks.  Any sutures or staples will be removed at the office during your follow-up visit. ?ACTIVITIES:  You may resume regular daily activities (gradually increasing) beginning the next day.  Wearing a good support bra or sports bra minimizes pain and swelling.  You may have sexual intercourse when it is comfortable. ?You may drive when you no longer are taking prescription pain medication, you can comfortably wear a seatbelt, and you can safely maneuver your car and apply brakes. ?RETURN TO WORK:  ______________________________________________________________________________________ ?You should  see your doctor in the office for a follow-up appointment approximately two weeks after your surgery.  Your doctor?s nurse will typically make your follow-up appointment when she calls you with your pathology report.  Expect your pathology report 3-4 business days after your surgery.  You may call to check if you do not hear from [place] after three days. ?OTHER INSTRUCTIONS: _______________________________________________________________________________________________ _____________________________________________________________________________________________________________________________________ ?_____________________________________________________________________________________________________________________________________ ?_____________________________________________________________________________________________________________________________________ ? ?WHEN TO CALL DR WAKEFIELD: ?Fever over 101.0 ?Nausea and/or vomiting. ?Extreme swelling or bruising. ?Continued bleeding from incision. ?Increased pain, redness, or drainage from the incision. ? ?The clinic staff is available to answer your questions during regular business hours.  Please don?t hesitate to call and ask to speak to one of the nurses for clinical concerns.  If you have a medical emergency, go to the nearest emergency room or call 911.  A surgeon from [facility] is always on call at the hospital. ? ?For further questions, please visit [email or web address] ? ?Post Anesthesia Home Care Instructions ? ?Activity: ?Get plenty of rest for the remainder of the day. A responsible individual must stay with you for 24 hours following the procedure.  ?For the next 24 hours, DO NOT: ?-Drive a car ?-Paediatric nurse ?-Drink alcoholic beverages ?-Take any medication unless instructed by your physician ?-Make any legal decisions or sign important papers. ? ?Meals: ?Start with liquid foods such as gelatin or soup. Progress to regular  foods as tolerated. Avoid greasy, spicy, heavy foods. If nausea and/or vomiting occur, drink only clear liquids until the nausea and/or vomiting subsides. Call your physician if vomiting continues. ? ?Special  Instructions/Symptoms: ?Your throat may feel dry or sore from the anesthesia or the breathing tube placed in your throat during surgery. If this causes discomfort, gargle with warm salt water. The discomfort should disappear within 24 hours. ? ?If you had a scopolamine patch placed behind your ear for the management of post- operative nausea and/or vomiting: ? ?1. The medication in the patch is effective for 72 hours, after which it should be removed.  Wrap patch in a tissue and discard in the trash. Wash hands thoroughly with soap and water. ?2. You may remove the patch earlier than 72 hours if you experience unpleasant side effects which may include dry mouth, dizziness or visual disturbances. ?3. Avoid touching the patch. Wash your hands with soap and water after contact with the patch. ?    ? ?

## 2022-01-17 NOTE — Op Note (Signed)
Preoperative diagnosis gnosis: Left breast DCIS ?Postoperative diagnosis: Same as above ?Procedure: Left breast radioactive seed guided lumpectomy ?Surgeon: Dr. Serita Grammes ?Anesthesia: General ?Specimens: Left breast tissue marked with paint containing seed and clip, additional superior and anterolateral margins marked short superior, long lateral and double deep ?Complications: None ?Drains: None ?Estimated blood loss: Minimal ?Special count was correct at completion ?Disposition to recovery stable condition ? ?Indications:65 y.o. female who is seen today for left breast DCIS. She has a history in 2005 of what sounds like an ER positive invasive ductal carcinoma that was treated with right breast lumpectomy, sentinel node biopsy, radiation, and tamoxifen for 5 years. She has genetic testing in 2015 that is negative. She was noted on screening mammogram to be density breast. She was noted to have a couple area of calcifications. She had a 5 mm retroareolar area and a 9 mm upper outer quadrant area. The upper outer quadrant biopsy is fibroadenomatoid change with dystrophic calcifications that is benign and concordant. The retroareolar area that measured 5 cm is high-grade ductal carcinoma in situ that is 20% ER positive and 2% PR positive. We elected to proceed with lumpectomy ? ?Procedure: After informed consent was obtained the patient first had a seed placed I had these mammograms available in the operating room.  She was given antibiotics.  SCDs were in place.  She was placed under general anesthesia without complication.  She was prepped and draped in the standard sterile surgical fashion.  A surgical timeout was then performed. ? ?I located the seed in the retroareolar position.  I infiltrated Marcaine and made a periareolar incision so that this would hide the scar later.  I then dissected down to the seed and removed the seed and surrounding tissue with attempt to get a clear margin. I painted this.  The  clip and seed were confirmed to be in specimen.  I then removed a couple additional margins as I thought these might be close on 3D imaging.    I then obtained hemostasis.  This was closed with 2-0 Vicryl, 3-0 Vicryl, and 5-0 Monocryl.  Glue and Steri-Strips were applied.  She tolerated this well was extubated transferred to recovery stable. ?

## 2022-01-18 ENCOUNTER — Encounter (HOSPITAL_BASED_OUTPATIENT_CLINIC_OR_DEPARTMENT_OTHER): Payer: Self-pay | Admitting: General Surgery

## 2022-01-21 LAB — SURGICAL PATHOLOGY

## 2022-01-23 ENCOUNTER — Encounter: Payer: Self-pay | Admitting: *Deleted

## 2022-01-25 NOTE — Progress Notes (Signed)
? ?Patient Care Team: ?Prince Solian, MD as PCP - General (Internal Medicine) ?Mauro Kaufmann, RN as Oncology Nurse Navigator ?Rockwell Germany, RN as Oncology Nurse Navigator ? ?DIAGNOSIS:  ?Encounter Diagnosis  ?Name Primary?  ? Ductal carcinoma in situ (DCIS) of left breast   ? ? ?SUMMARY OF ONCOLOGIC HISTORY: ?Oncology History  ?Ductal carcinoma in situ (DCIS) of left breast  ?2010 Miscellaneous  ? Prior history of right breast cancer treated with lumpectomy, radiation and tamoxifen for 5 years ?  ?11/30/2021 Initial Diagnosis  ? Screening mammogram detected left breast asymmetry and calcifications, on diagnostic mammogram: Indeterminate 5 mm group of calcifications retroareolar left breast (DCIS high-grade ER 20%, PR 2%, indeterminate 9 mm calcifications UOQ left breast (fibroadenoma with dystrophic calcs), ?  ?01/17/2022 Surgery  ? Left lumpectomy: High-grade DCIS with calcifications spanning 1 cm, resection margins are negative, ER 20%, PR 2% ?  ? ? ?CHIEF COMPLIANT:  discuss the final pathology report.  ? ?INTERVAL HISTORY: Andrea [name] is a 66 y.o. female is here because of recent diagnosis of left breast DCIS.  She had a routine screening mammogram that detected asymmetry and calcifications in the left breast. She presents to the clinic for a follow-up. She said everything went well after surgery.  She is healing and recovering very well from recent surgery. ? ? ?ALLERGIES:  has no active allergies. ? ?MEDICATIONS:  ?Current Outpatient Medications  ?Medication Sig Dispense Refill  ? amLODipine (NORVASC) 10 MG tablet Take 1 tablet by mouth daily.    ? Cholecalciferol (VITAMIN D PO) Take 2,000 Int'l Units by mouth.    ? COLLAGEN PO Take by mouth.    ? Cyanocobalamin (VITAMIN B 12) 500 MCG TABS Take 1 tablet by mouth daily.    ? ezetimibe (ZETIA) 10 MG tablet Take 10 mg by mouth daily.    ? famotidine (PEPCID) 20 MG tablet Take 20 mg by mouth 2 (two) times daily.    ? metFORMIN (GLUCOPHAGE) 500 MG  tablet TAKE 1 TABLET BY MOUTH DAILY WITH LARGEST MEAL OF THE DAY  3  ? TOPROL XL 100 MG 24 hr tablet Take 1 tablet by mouth daily.    ? ?No current facility-administered medications for this visit.  ? ? ?PHYSICAL EXAMINATION: ?ECOG PERFORMANCE STATUS: 1 - Symptomatic but completely ambulatory ? ?Vitals:  ? 01/28/22 1353  ?BP: 137/63  ?Pulse: 71  ?Resp: 18  ?Temp: 97.8 ?F (36.6 ?C)  ?SpO2: 99%  ? ?Filed Weights  ? 01/28/22 1353  ?Weight: 109 lb 14.4 oz (49.9 kg)  ?  ? ?LABORATORY DATA:  ?I have reviewed the data as listed ? ?  Latest Ref Rng & Units 01/11/2022  ?  1:00 PM 10/31/2009  ?  1:50 PM 03/15/2009  ?  8:58 AM  ?CMP  ?Glucose 70 - 99 mg/dL 191   100   120    ?BUN 8 - 23 mg/dL '11   15   13    '$ ?Creatinine 0.44 - 1.00 mg/dL 0.46   0.45   0.51    ?Sodium 135 - 145 mmol/L 137   141   140    ?Potassium 3.5 - 5.1 mmol/L 4.2   4.0   4.1    ?Chloride 98 - 111 mmol/L 101   104   106    ?CO2 22 - 32 mmol/L '25   27   24    '$ ?Calcium 8.9 - 10.3 mg/dL 9.3   9.2   9.3    ?  Total Protein 6.0 - 8.3 g/dL  6.8   6.9    ?Total Bilirubin 0.3 - 1.2 mg/dL  0.3   0.4    ?Alkaline Phos 39 - 117 U/L  66   64    ?AST 0 - 37 U/L  22   22    ?ALT 0 - 35 U/L  19   18    ? ? ?Lab Results  ?Component Value Date  ? WBC 6.0 10/31/2009  ? HGB 14.8 10/31/2009  ? HCT 42.6 10/31/2009  ? MCV 92.7 10/31/2009  ? PLT 245 10/31/2009  ? NEUTROABS 3.8 10/31/2009  ? ? ?ASSESSMENT & PLAN:  ?Ductal carcinoma in situ (DCIS) of left breast ?01/17/2022:Left lumpectomy: High-grade DCIS with calcifications spanning 1 cm, resection margins are negative, ER 20%, PR 2% ? ?Pathology counseling: I discussed the final pathology report of the patient provided  a copy of this report. I discussed the margins.  We also discussed the final staging along with previously performed ER/PR testing. ? ?Treatment plan: ?1.  Adjuvant radiation therapy ?2. followed by adjuvant antiestrogen therapy with tamoxifen once daily x5 years ? ? ?Tamoxifen counseling:We discussed the risks and  benefits of tamoxifen. These include but not limited to insomnia, hot flashes, mood changes, vaginal dryness, and weight gain. Although rare, serious side effects including endometrial cancer, risk of blood clots were also discussed. We strongly believe that the benefits far outweigh the risks. Patient understands these risks and consented to starting treatment. Planned treatment duration is 5 years. ? ? ?Patient is a cancer policy which will support her cancer treatments. ?Return to clinic at the end of radiation to start tamoxifen. ? ? ? ?No orders of the defined types were placed in this encounter. ? ?The patient has a good understanding of the overall plan. she agrees with it. she will call with any problems that may develop before the next visit here. ?Total time spent: 30 mins including face to face time and time spent for planning, charting and co-ordination of care ? ? Harriette Ohara, MD ?01/28/22 ? ? ? I Gardiner Coins am scribing for Dr. Lindi Adie ? ?I have reviewed the above documentation for accuracy and completeness, and I agree with the above. ?  ?

## 2022-01-28 ENCOUNTER — Inpatient Hospital Stay: Payer: BC Managed Care – PPO | Attending: Hematology and Oncology | Admitting: Hematology and Oncology

## 2022-01-28 ENCOUNTER — Other Ambulatory Visit: Payer: Self-pay

## 2022-01-28 DIAGNOSIS — Z79899 Other long term (current) drug therapy: Secondary | ICD-10-CM | POA: Insufficient documentation

## 2022-01-28 DIAGNOSIS — D0512 Intraductal carcinoma in situ of left breast: Secondary | ICD-10-CM | POA: Insufficient documentation

## 2022-01-28 DIAGNOSIS — Z79810 Long term (current) use of selective estrogen receptor modulators (SERMs): Secondary | ICD-10-CM | POA: Diagnosis not present

## 2022-01-28 NOTE — Assessment & Plan Note (Addendum)
01/17/2022:Left lumpectomy: High-grade DCIS with calcifications spanning 1 cm, resection margins are negative, ER 20%, PR 2% ? ?Pathology counseling: I discussed the final pathology report of the patient provided  a copy of this report. I discussed the margins.  We also discussed the final staging along with previously performed ER/PR testing. ? ?Treatment plan: ?1.  Adjuvant radiation therapy ?2. followed by adjuvant antiestrogen therapy with tamoxifen once daily x5 years ? ? ?Tamoxifen counseling:We discussed the risks and benefits of tamoxifen. These include but not limited to insomnia, hot flashes, mood changes, vaginal dryness, and weight gain. Although rare, serious side effects including endometrial cancer, risk of blood clots were also discussed. We strongly believe that the benefits far outweigh the risks. Patient understands these risks and consented to starting treatment. Planned treatment duration is 5 years. ? ? ?Patient is a cancer policy which will support her cancer treatments. ?Return to clinic at the end of radiation to start tamoxifen. ?

## 2022-01-29 ENCOUNTER — Telehealth: Payer: Self-pay | Admitting: Obstetrics and Gynecology

## 2022-01-29 NOTE — Telephone Encounter (Signed)
Ok to remove from mammogram hold. ?Patient has seen oncology for DCIS left breast.  ?

## 2022-01-30 ENCOUNTER — Encounter (HOSPITAL_COMMUNITY): Payer: Self-pay

## 2022-01-30 NOTE — Telephone Encounter (Signed)
Removed from hold. ?

## 2022-02-18 NOTE — Progress Notes (Signed)
?Radiation Oncology         [phone]) [phone] ?________________________________ ? ?Name: Andrea [name] MRN: [id number]  ?Date: 02/19/2022  DOB: Jul 09, 1956 ? ?Follow-Up Visit Note ? ?Outpatient ? ?CC: Avva, Steva Ready, MD  Nicholas Lose, MD ? ?Diagnosis:    ?  ICD-10-CM   ?1. Ductal carcinoma in situ (DCIS) of left breast  D05.12   ?  ?  ? ?S/p lumpectomy: Stage 0 (cTis (DCIS), cN0, cM0) Left Breast, High-grade ductal carcinoma in-situ, ER+ / PR+ / Her2 not assessed ? ?CHIEF COMPLAINT: Here to discuss management of left breast DCIS ? ?Narrative:  The patient returns today for follow-up.   ?  ?Since consultation date of 12/28/21, the patient opted to proceed with left breast lumpectomy on 01/17/22 under the care of Dr. Donne Hazel. Pathology from the procedure revealed: tumor span the size of 1 cm; histology of high-grade ductal carcinoma in-situ with calcifications, fibrocystic changes, adenosis, and usual ductal hyperplasia; all final margins negative for in-situ disease; margin status to in-situ disease of less than 1 mm from the posterior margin; no lymph nodes were examined;  ER status: 20% positive; PR status 2% positive (both with strong staining intensity), Her2 not assessed.  ? ?Per her most recent follow up visit with Dr. Donne Hazel on 01/30/22, the patient was noted to be doing well from a post-op standpoint. Physical exam performed during this visit revealed a small hematoma but otherwise no signs of infection or seroma were appreciated.  ? ?The patient has met with Dr. Lindi Adie and will proceed to begin tamoxifen following XRT.  ? ?Symptomatically, the patient reports:  ?Lymphedema issues, if any:  Patient denies   ? ?Pain issues, if any:  Reports occasional twinges/aching to the left breast. States they resolve on their own, or after a dose of OTC Tylenol  ? ?SAFETY ISSUES: ?Prior radiation? Yes back in 2005 for right sided breast cancer ?Pacemaker/ICD? No ?Possible current pregnancy? No--postmenopausal  ?Is  the patient on methotrexate? No ? ?Current Complaints / other details:  Nothing else of note  ?   ? ?       ?ALLERGIES:  is allergic to alendronate sodium, statins, and telmisartan. ? ?Meds: ?Current Outpatient Medications  ?Medication Sig Dispense Refill  ? amLODipine (NORVASC) 10 MG tablet Take 1 tablet by mouth daily.    ? Cholecalciferol (VITAMIN D PO) Take 2,000 Int'l Units by mouth.    ? COLLAGEN PO Take by mouth.    ? Cyanocobalamin (VITAMIN B 12) 500 MCG TABS Take 1 tablet by mouth daily.    ? ezetimibe (ZETIA) 10 MG tablet Take 10 mg by mouth daily.    ? famotidine (PEPCID) 20 MG tablet Take 20 mg by mouth 2 (two) times daily.    ? metFORMIN (GLUCOPHAGE) 500 MG tablet TAKE 1 TABLET BY MOUTH DAILY WITH LARGEST MEAL OF THE DAY  3  ? TOPROL XL 100 MG 24 hr tablet Take 1 tablet by mouth daily.    ? ?No current facility-administered medications for this encounter.  ? ? ?Physical Findings: ? height is 4' 9"  (1.448 m) and weight is 111 lb (50.3 kg). Her temporal temperature is 96.6 ?F (35.9 ?C) (abnormal). Her blood pressure is 118/62 and her pulse is 70. Her respiration is 18 and oxygen saturation is 100%. .    ? ?General: Alert and oriented, in no acute distress ?Psychiatric: Judgment and insight are intact. Affect is appropriate. ?Breast exam reveals satisfactory healing of left lumpectomy scar ? ?Lab Findings: ?Lab  Results  ?Component Value Date  ? WBC 6.0 10/31/2009  ? HGB 14.8 10/31/2009  ? HCT 42.6 10/31/2009  ? MCV 92.7 10/31/2009  ? PLT 245 10/31/2009  ? ? ?@LASTCHEMISTRY @ ? ?Radiographic Findings: ?No results found. ? ?Impression/Plan: ?We discussed adjuvant radiotherapy today.  I recommend radiation therapy to the left breast over 4 weeks in order to reduce risk of local regional recurrence by at least half.  I reviewed the logistics, benefits, risks, and potential side effects of this treatment in detail. Risks may include but not necessary be limited to acute and late injury tissue in the radiation  fields such as skin irritation (change in color/pigmentation, itching, dryness, pain, peeling). She may experience fatigue. We also discussed possible risk of long term cosmetic changes or scar tissue. There is also a smaller risk for lung toxicity, cardiac toxicity, lymphedema, musculoskeletal changes, rib fragility or induction of a second malignancy, late chronic non-healing soft tissue wound.   ? ?The patient asked good questions which I answered to her satisfaction. She is enthusiastic about proceeding with treatment. A consent form has been signed and placed in her chart.  We will proceed with treatment planning today and start her treatment in about 1 week.  A letter was provided to the patient to notify her supervisor of this medically necessary appointment. ? ?On date of service, in total, I spent 30 minutes on this encounter. Patient was seen in person.  ?_____________________________________ ? ? ?Eppie Gibson, MD ? ?This document serves as a record of services personally performed by Eppie Gibson, MD. It was created on her behalf by Roney Mans, a trained medical scribe. The creation of this record is based on the scribe's personal observations and the provider's statements to them. This document has been checked and approved by the attending provider. ? ?

## 2022-02-18 NOTE — Progress Notes (Signed)
Location of Breast Cancer:  ?Ductal carcinoma in situ (DCIS) of left breast ? ?Histology per Pathology Report:  ?01/17/2022 ?FINAL MICROSCOPIC DIAGNOSIS:  ?A. BREAST, LEFT, LUMEPCTOMY:  ?- High-grade ductal carcinoma in situ with calcifications, spanning  ?about 1 cm  ?- Resection margins are negative for DCIS - closest is the posterior  ?margin at less than 1 mm  ?- Fibrocystic change with adenosis and usual ductal hyperplasia  ?- Biopsy site changes  ?- See oncology table  ?B. BREAST, LEFT SUPERIOR MARGIN, EXCISION:  ?- High-grade ductal carcinoma in situ  ?- The superior margin is negative for DCIS (0.3 cm)  ?C. BREAST, LEFT ANTERO-LATERAL MARGIN, EXCISION:  ?- Benign breast parenchyma, negative for carcinoma  ? ?Receptor Status: ER(20%), PR (2%) ? ?Did patient present with symptoms (if so, please note symptoms) or was this found on screening mammography?: Screening mammogram noted noted to have: a couple areas of calcifications, as well as a 65m retroareolar area and a 9 mm upper outer quadrant area.  ? ?Past/Anticipated interventions by surgeon, if any:  ?01/30/2022 ?--Dr. MRolm Bookbinder(office visit) ?She is doing fine without any issues.  ?Her pathology is high-grade DCIS measuring about 1 cm with the closest being focally less than a millimeter at the posterior margin.  ?I took some additional superior margin and then had some more DCIS but this is also negative. ?We are going to proceed with radiation and then antiestrogens.  ?I will see her in 1 year for routine follow-up. ? ?01/17/2022 ?--Dr. MRolm Bookbinder?Left breast radioactive seed guided lumpectomy ? ?Past/Anticipated interventions by medical oncology, if any:  ?Under care of Dr. VNicholas Lose?01/28/2022 ?--Treatment plan: ?1.  Adjuvant radiation therapy ?2. followed by adjuvant antiestrogen therapy with tamoxifen once daily x5 years ?--Patient is a cancer policy which will support her cancer treatments. ?--Return to clinic at the end of  radiation to start tamoxifen ? ?Lymphedema issues, if any:  Patient denies   ? ?Pain issues, if any:  Reports occasional twinges/aching to the left breast. States they resolve on their own, or after a dose of OTC Tylenol  ? ?SAFETY ISSUES: ?Prior radiation? Yes back in 2005 for right sided breast cancer ?Pacemaker/ICD? No ?Possible current pregnancy? No--postmenopausal  ?Is the patient on methotrexate? No ? ?Current Complaints / other details:  Nothing else of note  ?   ? ? ? ?

## 2022-02-19 ENCOUNTER — Ambulatory Visit
Admission: RE | Admit: 2022-02-19 | Discharge: 2022-02-19 | Disposition: A | Discharge status: Home / Self Care | Payer: BC Managed Care – PPO | Source: Ambulatory Visit | Attending: Radiation Oncology | Admitting: Radiation Oncology

## 2022-02-19 ENCOUNTER — Encounter: Payer: Self-pay | Admitting: Radiation Oncology

## 2022-02-19 ENCOUNTER — Other Ambulatory Visit: Payer: Self-pay

## 2022-02-19 VITALS — BP 118/62 | HR 70 | Temp 96.6°F | Resp 18 | Ht <= 58 in | Wt 111.0 lb

## 2022-02-19 DIAGNOSIS — Z170 Estrogen receptor positive status [ER+]: Secondary | ICD-10-CM | POA: Diagnosis not present

## 2022-02-19 DIAGNOSIS — Z79899 Other long term (current) drug therapy: Secondary | ICD-10-CM | POA: Diagnosis not present

## 2022-02-19 DIAGNOSIS — Z7984 Long term (current) use of oral hypoglycemic drugs: Secondary | ICD-10-CM | POA: Insufficient documentation

## 2022-02-19 DIAGNOSIS — D0512 Intraductal carcinoma in situ of left breast: Secondary | ICD-10-CM | POA: Insufficient documentation

## 2022-02-22 DIAGNOSIS — D0512 Intraductal carcinoma in situ of left breast: Secondary | ICD-10-CM | POA: Diagnosis not present

## 2022-02-22 DIAGNOSIS — Z170 Estrogen receptor positive status [ER+]: Secondary | ICD-10-CM | POA: Diagnosis not present

## 2022-02-26 ENCOUNTER — Encounter: Payer: Self-pay | Admitting: *Deleted

## 2022-02-27 ENCOUNTER — Other Ambulatory Visit: Payer: Self-pay

## 2022-02-27 ENCOUNTER — Telehealth: Payer: Self-pay | Admitting: Hematology and Oncology

## 2022-02-27 ENCOUNTER — Ambulatory Visit
Admission: RE | Admit: 2022-02-27 | Discharge: 2022-02-27 | Disposition: A | Discharge status: Home / Self Care | Payer: BC Managed Care – PPO | Source: Ambulatory Visit | Attending: Radiation Oncology | Admitting: Radiation Oncology

## 2022-02-27 DIAGNOSIS — Z510 Encounter for antineoplastic radiation therapy: Secondary | ICD-10-CM | POA: Diagnosis not present

## 2022-02-27 DIAGNOSIS — D0512 Intraductal carcinoma in situ of left breast: Secondary | ICD-10-CM | POA: Diagnosis not present

## 2022-02-27 DIAGNOSIS — Z170 Estrogen receptor positive status [ER+]: Secondary | ICD-10-CM | POA: Diagnosis not present

## 2022-02-27 LAB — RAD ONC ARIA SESSION SUMMARY
Course Elapsed Days: 0
Plan Fractions Treated to Date: 1
Plan Prescribed Dose Per Fraction: 2.67 Gy
Plan Total Fractions Prescribed: 15
Plan Total Prescribed Dose: 40.05 Gy
Reference Point Dosage Given to Date: 2.67 Gy
Reference Point Session Dosage Given: 2.67 Gy
Session Number: 1

## 2022-02-27 NOTE — Telephone Encounter (Signed)
.  Called patient to schedule appointment per 5/2 inb, patient is aware of date and time.   ?

## 2022-02-28 ENCOUNTER — Ambulatory Visit
Admission: RE | Admit: 2022-02-28 | Discharge: 2022-02-28 | Disposition: A | Discharge status: Home / Self Care | Payer: BC Managed Care – PPO | Source: Ambulatory Visit | Attending: Radiation Oncology | Admitting: Radiation Oncology

## 2022-02-28 ENCOUNTER — Other Ambulatory Visit: Payer: Self-pay

## 2022-02-28 DIAGNOSIS — Z510 Encounter for antineoplastic radiation therapy: Secondary | ICD-10-CM | POA: Diagnosis not present

## 2022-02-28 DIAGNOSIS — Z170 Estrogen receptor positive status [ER+]: Secondary | ICD-10-CM | POA: Diagnosis not present

## 2022-02-28 DIAGNOSIS — D0512 Intraductal carcinoma in situ of left breast: Secondary | ICD-10-CM | POA: Diagnosis not present

## 2022-02-28 LAB — RAD ONC ARIA SESSION SUMMARY
Course Elapsed Days: 1
Plan Fractions Treated to Date: 2
Plan Prescribed Dose Per Fraction: 2.67 Gy
Plan Total Fractions Prescribed: 15
Plan Total Prescribed Dose: 40.05 Gy
Reference Point Dosage Given to Date: 5.34 Gy
Reference Point Session Dosage Given: 2.67 Gy
Session Number: 2

## 2022-03-01 ENCOUNTER — Ambulatory Visit
Admission: RE | Admit: 2022-03-01 | Discharge: 2022-03-01 | Disposition: A | Discharge status: Home / Self Care | Payer: BC Managed Care – PPO | Source: Ambulatory Visit | Attending: Radiation Oncology | Admitting: Radiation Oncology

## 2022-03-01 ENCOUNTER — Other Ambulatory Visit: Payer: Self-pay

## 2022-03-01 DIAGNOSIS — Z510 Encounter for antineoplastic radiation therapy: Secondary | ICD-10-CM | POA: Diagnosis not present

## 2022-03-01 DIAGNOSIS — D0512 Intraductal carcinoma in situ of left breast: Secondary | ICD-10-CM | POA: Diagnosis not present

## 2022-03-01 DIAGNOSIS — Z170 Estrogen receptor positive status [ER+]: Secondary | ICD-10-CM | POA: Diagnosis not present

## 2022-03-01 LAB — RAD ONC ARIA SESSION SUMMARY
Course Elapsed Days: 2
Plan Fractions Treated to Date: 3
Plan Prescribed Dose Per Fraction: 2.67 Gy
Plan Total Fractions Prescribed: 15
Plan Total Prescribed Dose: 40.05 Gy
Reference Point Dosage Given to Date: 8.01 Gy
Reference Point Session Dosage Given: 2.67 Gy
Session Number: 3

## 2022-03-04 ENCOUNTER — Ambulatory Visit
Admission: RE | Admit: 2022-03-04 | Discharge: 2022-03-04 | Disposition: A | Discharge status: Home / Self Care | Payer: BC Managed Care – PPO | Source: Ambulatory Visit | Attending: Radiation Oncology | Admitting: Radiation Oncology

## 2022-03-04 ENCOUNTER — Other Ambulatory Visit: Payer: Self-pay

## 2022-03-04 DIAGNOSIS — Z170 Estrogen receptor positive status [ER+]: Secondary | ICD-10-CM | POA: Diagnosis not present

## 2022-03-04 DIAGNOSIS — Z510 Encounter for antineoplastic radiation therapy: Secondary | ICD-10-CM | POA: Diagnosis not present

## 2022-03-04 DIAGNOSIS — D0512 Intraductal carcinoma in situ of left breast: Secondary | ICD-10-CM | POA: Diagnosis not present

## 2022-03-04 LAB — RAD ONC ARIA SESSION SUMMARY
Course Elapsed Days: 5
Plan Fractions Treated to Date: 4
Plan Prescribed Dose Per Fraction: 2.67 Gy
Plan Total Fractions Prescribed: 15
Plan Total Prescribed Dose: 40.05 Gy
Reference Point Dosage Given to Date: 10.68 Gy
Reference Point Session Dosage Given: 2.67 Gy
Session Number: 4

## 2022-03-04 MED ORDER — RADIAPLEXRX EX GEL: Freq: Once | CUTANEOUS | Status: AC

## 2022-03-04 MED ORDER — ALRA NON-METALLIC DEODORANT (RAD-ONC)
1.0000 "application " | Freq: Once | TOPICAL | Status: AC
  Administered 2022-03-04: 1 via TOPICAL

## 2022-03-04 NOTE — Progress Notes (Signed)
Per patient's request, education material left at [facility] for patient to review on her own. Given her work schedule, patient will be seen by Dr. Isidore Moos directly on the machine for her her weekly PUT appointment. Provided my business card for patient to call directly if she has any questions/concerns ? ?Pt given Radiation and You booklet, skin care instructions, Alra deodorant, and Radiaplex gel.   ? ?Reviewed areas of pertinence such as fatigue, hair loss in treatment field, skin changes, breast tenderness, breast swelling, and taste changes .  ? ?Pt able to give teach back of to pat skin, use unscented/gentle soap, and drink plenty of water,apply Radiaplex bid, avoid applying anything to skin within 4 hours of treatment, avoid wearing an under wire bra, and to use an electric razor if they must shave.  ? ?Unable to assess patient's understanding, but she will contact nursing with any questions or concerns.   ? ?[email or web address] ?  ? ? ? ? ? ? ?

## 2022-03-05 ENCOUNTER — Other Ambulatory Visit: Payer: Self-pay

## 2022-03-05 ENCOUNTER — Ambulatory Visit
Admission: RE | Admit: 2022-03-05 | Discharge: 2022-03-05 | Disposition: A | Discharge status: Home / Self Care | Payer: BC Managed Care – PPO | Source: Ambulatory Visit | Attending: Radiation Oncology | Admitting: Radiation Oncology

## 2022-03-05 DIAGNOSIS — Z510 Encounter for antineoplastic radiation therapy: Secondary | ICD-10-CM | POA: Diagnosis not present

## 2022-03-05 DIAGNOSIS — D0512 Intraductal carcinoma in situ of left breast: Secondary | ICD-10-CM | POA: Diagnosis not present

## 2022-03-05 DIAGNOSIS — Z170 Estrogen receptor positive status [ER+]: Secondary | ICD-10-CM | POA: Diagnosis not present

## 2022-03-05 LAB — RAD ONC ARIA SESSION SUMMARY
Course Elapsed Days: 6
Plan Fractions Treated to Date: 5
Plan Prescribed Dose Per Fraction: 2.67 Gy
Plan Total Fractions Prescribed: 15
Plan Total Prescribed Dose: 40.05 Gy
Reference Point Dosage Given to Date: 13.35 Gy
Reference Point Session Dosage Given: 2.67 Gy
Session Number: 5

## 2022-03-06 ENCOUNTER — Ambulatory Visit
Admission: RE | Admit: 2022-03-06 | Discharge: 2022-03-06 | Disposition: A | Discharge status: Home / Self Care | Payer: BC Managed Care – PPO | Source: Ambulatory Visit | Attending: Radiation Oncology | Admitting: Radiation Oncology

## 2022-03-06 ENCOUNTER — Other Ambulatory Visit: Payer: Self-pay

## 2022-03-06 DIAGNOSIS — Z510 Encounter for antineoplastic radiation therapy: Secondary | ICD-10-CM | POA: Diagnosis not present

## 2022-03-06 DIAGNOSIS — D0512 Intraductal carcinoma in situ of left breast: Secondary | ICD-10-CM | POA: Diagnosis not present

## 2022-03-06 DIAGNOSIS — Z170 Estrogen receptor positive status [ER+]: Secondary | ICD-10-CM | POA: Diagnosis not present

## 2022-03-06 LAB — RAD ONC ARIA SESSION SUMMARY
Course Elapsed Days: 7
Plan Fractions Treated to Date: 6
Plan Prescribed Dose Per Fraction: 2.67 Gy
Plan Total Fractions Prescribed: 15
Plan Total Prescribed Dose: 40.05 Gy
Reference Point Dosage Given to Date: 16.02 Gy
Reference Point Session Dosage Given: 2.67 Gy
Session Number: 6

## 2022-03-07 ENCOUNTER — Other Ambulatory Visit: Payer: Self-pay

## 2022-03-07 ENCOUNTER — Ambulatory Visit
Admission: RE | Admit: 2022-03-07 | Discharge: 2022-03-07 | Disposition: A | Discharge status: Home / Self Care | Payer: BC Managed Care – PPO | Source: Ambulatory Visit | Attending: Radiation Oncology | Admitting: Radiation Oncology

## 2022-03-07 DIAGNOSIS — D0512 Intraductal carcinoma in situ of left breast: Secondary | ICD-10-CM | POA: Diagnosis not present

## 2022-03-07 DIAGNOSIS — Z170 Estrogen receptor positive status [ER+]: Secondary | ICD-10-CM | POA: Diagnosis not present

## 2022-03-07 DIAGNOSIS — Z510 Encounter for antineoplastic radiation therapy: Secondary | ICD-10-CM | POA: Diagnosis not present

## 2022-03-07 LAB — RAD ONC ARIA SESSION SUMMARY
Course Elapsed Days: 8
Plan Fractions Treated to Date: 7
Plan Prescribed Dose Per Fraction: 2.67 Gy
Plan Total Fractions Prescribed: 15
Plan Total Prescribed Dose: 40.05 Gy
Reference Point Dosage Given to Date: 18.69 Gy
Reference Point Session Dosage Given: 2.67 Gy
Session Number: 7

## 2022-03-08 ENCOUNTER — Other Ambulatory Visit: Payer: Self-pay

## 2022-03-08 ENCOUNTER — Ambulatory Visit
Admission: RE | Admit: 2022-03-08 | Discharge: 2022-03-08 | Disposition: A | Discharge status: Home / Self Care | Payer: BC Managed Care – PPO | Source: Ambulatory Visit | Attending: Radiation Oncology | Admitting: Radiation Oncology

## 2022-03-08 DIAGNOSIS — D0512 Intraductal carcinoma in situ of left breast: Secondary | ICD-10-CM | POA: Diagnosis not present

## 2022-03-08 DIAGNOSIS — Z170 Estrogen receptor positive status [ER+]: Secondary | ICD-10-CM | POA: Diagnosis not present

## 2022-03-08 DIAGNOSIS — Z510 Encounter for antineoplastic radiation therapy: Secondary | ICD-10-CM | POA: Diagnosis not present

## 2022-03-08 LAB — RAD ONC ARIA SESSION SUMMARY
Course Elapsed Days: 9
Plan Fractions Treated to Date: 8
Plan Prescribed Dose Per Fraction: 2.67 Gy
Plan Total Fractions Prescribed: 15
Plan Total Prescribed Dose: 40.05 Gy
Reference Point Dosage Given to Date: 21.36 Gy
Reference Point Session Dosage Given: 2.67 Gy
Session Number: 8

## 2022-03-11 ENCOUNTER — Ambulatory Visit
Admission: RE | Admit: 2022-03-11 | Discharge: 2022-03-11 | Disposition: A | Discharge status: Home / Self Care | Payer: BC Managed Care – PPO | Source: Ambulatory Visit | Attending: Radiation Oncology | Admitting: Radiation Oncology

## 2022-03-11 ENCOUNTER — Other Ambulatory Visit: Payer: Self-pay

## 2022-03-11 DIAGNOSIS — D0512 Intraductal carcinoma in situ of left breast: Secondary | ICD-10-CM

## 2022-03-11 DIAGNOSIS — Z170 Estrogen receptor positive status [ER+]: Secondary | ICD-10-CM | POA: Diagnosis not present

## 2022-03-11 DIAGNOSIS — Z510 Encounter for antineoplastic radiation therapy: Secondary | ICD-10-CM | POA: Diagnosis not present

## 2022-03-11 LAB — RAD ONC ARIA SESSION SUMMARY
Course Elapsed Days: 12
Plan Fractions Treated to Date: 9
Plan Prescribed Dose Per Fraction: 2.67 Gy
Plan Total Fractions Prescribed: 15
Plan Total Prescribed Dose: 40.05 Gy
Reference Point Dosage Given to Date: 24.03 Gy
Reference Point Session Dosage Given: 2.67 Gy
Session Number: 9

## 2022-03-11 MED ORDER — RADIAPLEXRX EX GEL: Freq: Once | CUTANEOUS | Status: AC

## 2022-03-12 ENCOUNTER — Ambulatory Visit
Admission: RE | Admit: 2022-03-12 | Discharge: 2022-03-12 | Disposition: A | Discharge status: Home / Self Care | Payer: BC Managed Care – PPO | Source: Ambulatory Visit | Attending: Radiation Oncology | Admitting: Radiation Oncology

## 2022-03-12 ENCOUNTER — Other Ambulatory Visit: Payer: Self-pay

## 2022-03-12 DIAGNOSIS — Z170 Estrogen receptor positive status [ER+]: Secondary | ICD-10-CM | POA: Diagnosis not present

## 2022-03-12 DIAGNOSIS — Z510 Encounter for antineoplastic radiation therapy: Secondary | ICD-10-CM | POA: Diagnosis not present

## 2022-03-12 DIAGNOSIS — D0512 Intraductal carcinoma in situ of left breast: Secondary | ICD-10-CM | POA: Diagnosis not present

## 2022-03-12 LAB — RAD ONC ARIA SESSION SUMMARY
Course Elapsed Days: 13
Plan Fractions Treated to Date: 10
Plan Prescribed Dose Per Fraction: 2.67 Gy
Plan Total Fractions Prescribed: 15
Plan Total Prescribed Dose: 40.05 Gy
Reference Point Dosage Given to Date: 26.7 Gy
Reference Point Session Dosage Given: 2.67 Gy
Session Number: 10

## 2022-03-13 ENCOUNTER — Other Ambulatory Visit: Payer: Self-pay

## 2022-03-13 ENCOUNTER — Ambulatory Visit
Admission: RE | Admit: 2022-03-13 | Discharge: 2022-03-13 | Disposition: A | Discharge status: Home / Self Care | Payer: BC Managed Care – PPO | Source: Ambulatory Visit | Attending: Radiation Oncology | Admitting: Radiation Oncology

## 2022-03-13 DIAGNOSIS — D0512 Intraductal carcinoma in situ of left breast: Secondary | ICD-10-CM | POA: Diagnosis not present

## 2022-03-13 DIAGNOSIS — Z510 Encounter for antineoplastic radiation therapy: Secondary | ICD-10-CM | POA: Diagnosis not present

## 2022-03-13 DIAGNOSIS — Z170 Estrogen receptor positive status [ER+]: Secondary | ICD-10-CM | POA: Diagnosis not present

## 2022-03-13 LAB — RAD ONC ARIA SESSION SUMMARY
Course Elapsed Days: 14
Plan Fractions Treated to Date: 11
Plan Prescribed Dose Per Fraction: 2.67 Gy
Plan Total Fractions Prescribed: 15
Plan Total Prescribed Dose: 40.05 Gy
Reference Point Dosage Given to Date: 29.37 Gy
Reference Point Session Dosage Given: 2.67 Gy
Session Number: 11

## 2022-03-14 ENCOUNTER — Other Ambulatory Visit: Payer: Self-pay

## 2022-03-14 ENCOUNTER — Ambulatory Visit
Admission: RE | Admit: 2022-03-14 | Discharge: 2022-03-14 | Disposition: A | Discharge status: Home / Self Care | Payer: BC Managed Care – PPO | Source: Ambulatory Visit | Attending: Radiation Oncology | Admitting: Radiation Oncology

## 2022-03-14 DIAGNOSIS — D0512 Intraductal carcinoma in situ of left breast: Secondary | ICD-10-CM | POA: Diagnosis not present

## 2022-03-14 DIAGNOSIS — Z170 Estrogen receptor positive status [ER+]: Secondary | ICD-10-CM | POA: Diagnosis not present

## 2022-03-14 DIAGNOSIS — Z510 Encounter for antineoplastic radiation therapy: Secondary | ICD-10-CM | POA: Diagnosis not present

## 2022-03-14 LAB — RAD ONC ARIA SESSION SUMMARY
Course Elapsed Days: 15
Plan Fractions Treated to Date: 12
Plan Prescribed Dose Per Fraction: 2.67 Gy
Plan Total Fractions Prescribed: 15
Plan Total Prescribed Dose: 40.05 Gy
Reference Point Dosage Given to Date: 32.04 Gy
Reference Point Session Dosage Given: 2.67 Gy
Session Number: 12

## 2022-03-14 NOTE — Progress Notes (Signed)
Patient Care Team: Prince Solian, MD as PCP - General (Internal Medicine) Mauro Kaufmann, RN as Oncology Nurse Navigator Rockwell Germany, RN as Oncology Nurse Navigator  DIAGNOSIS:  Encounter Diagnosis  Name Primary?   Ductal carcinoma in situ (DCIS) of left breast     SUMMARY OF ONCOLOGIC HISTORY: Oncology History  Ductal carcinoma in situ (DCIS) of left breast  2010 Miscellaneous   Prior history of right breast cancer treated with lumpectomy, radiation and tamoxifen for 5 years   11/30/2021 Initial Diagnosis   Screening mammogram detected left breast asymmetry and calcifications, on diagnostic mammogram: Indeterminate 5 mm group of calcifications retroareolar left breast (DCIS high-grade ER 20%, PR 2%, indeterminate 9 mm calcifications UOQ left breast (fibroadenoma with dystrophic calcs),   01/17/2022 Surgery   Left lumpectomy: High-grade DCIS with calcifications spanning 1 cm, resection margins are negative, ER 20%, PR 2%     CHIEF COMPLIANT: DCIS left breast  INTERVAL HISTORY: Andrea [name] is a 66 y.o. female is here because of recent diagnosis of left breast DCIS. She presents to the clinic for a follow-up. She states that surgery went well.  She is tolerating radiation fairly well.  She finished radiation on the May 31.   ALLERGIES:  is allergic to alendronate sodium, statins, and telmisartan.  MEDICATIONS:  Current Outpatient Medications  Medication Sig Dispense Refill   [START ON 04/10/2022] tamoxifen (NOLVADEX) 20 MG tablet Take 1 tablet (20 mg total) by mouth daily. 90 tablet 3   amLODipine (NORVASC) 10 MG tablet Take 1 tablet by mouth daily.     Cholecalciferol (VITAMIN D PO) Take 2,000 Int'l Units by mouth.     COLLAGEN PO Take by mouth.     Cyanocobalamin (VITAMIN B 12) 500 MCG TABS Take 1 tablet by mouth daily.     ezetimibe (ZETIA) 10 MG tablet Take 10 mg by mouth daily.     famotidine (PEPCID) 20 MG tablet Take 20 mg by mouth 2 (two) times daily.      metFORMIN (GLUCOPHAGE) 500 MG tablet TAKE 1 TABLET BY MOUTH DAILY WITH LARGEST MEAL OF THE DAY  3   TOPROL XL 100 MG 24 hr tablet Take 1 tablet by mouth daily.     No current facility-administered medications for this visit.    PHYSICAL EXAMINATION: ECOG PERFORMANCE STATUS: 1 - Symptomatic but completely ambulatory  Vitals:   03/21/22 1357  BP: (!) 157/68  Pulse: 84  Resp: 17  Temp: (!) 97.5 F (36.4 C)  SpO2: 98%   Filed Weights   03/21/22 1357  Weight: 111 lb 6.4 oz (50.5 kg)      LABORATORY DATA:  I have reviewed the data as listed    Latest Ref Rng & Units 01/11/2022    1:00 PM 10/31/2009    1:50 PM 03/15/2009    8:58 AM  CMP  Glucose 70 - 99 mg/dL 191   100   120    BUN 8 - 23 mg/dL '11   15   13    '$ Creatinine 0.44 - 1.00 mg/dL 0.46   0.45   0.51    Sodium 135 - 145 mmol/L 137   141   140    Potassium 3.5 - 5.1 mmol/L 4.2   4.0   4.1    Chloride 98 - 111 mmol/L 101   104   106    CO2 22 - 32 mmol/L '25   27   24    '$ Calcium 8.9 -  10.3 mg/dL 9.3   9.2   9.3    Total Protein 6.0 - 8.3 g/dL  6.8   6.9    Total Bilirubin 0.3 - 1.2 mg/dL  0.3   0.4    Alkaline Phos 39 - 117 U/L  66   64    AST 0 - 37 U/L  22   22    ALT 0 - 35 U/L  19   18      Lab Results  Component Value Date   WBC 6.0 10/31/2009   HGB 14.8 10/31/2009   HCT 42.6 10/31/2009   MCV 92.7 10/31/2009   PLT 245 10/31/2009   NEUTROABS 3.8 10/31/2009    ASSESSMENT & PLAN:  Ductal carcinoma in situ (DCIS) of left breast 01/17/2022:Left lumpectomy: High-grade DCIS with calcifications spanning 1 cm, resection margins are negative, ER 20%, PR 2% 02/28/2022-03/27/2022: Adjuvant radiation 03/28/2022: Tamoxifen once daily x5 years to start 04/11/2022 (She previously took tamoxifen and had done extremely well)  Tamoxifen counseling: We discussed the risks and benefits of tamoxifen. These include but not limited to insomnia, hot flashes, mood changes, vaginal dryness, and weight gain. Although rare, serious side  effects including endometrial cancer, risk of blood clots were also discussed. We strongly believe that the benefits far outweigh the risks. Patient understands these risks and consented to starting treatment. Planned treatment duration is 5 years.  Return to clinic in 3 months for survivorship care plan visit and after that I can see her once a year      No orders of the defined types were placed in this encounter.  The patient has a good understanding of the overall plan. she agrees with it. she will call with any problems that may develop before the next visit here. Total time spent: 30 mins including face to face time and time spent for planning, charting and co-ordination of care   Harriette Ohara, MD 03/21/22    I Gardiner Coins am scribing for Dr. Lindi Adie  I have reviewed the above documentation for accuracy and completeness, and I agree with the above.

## 2022-03-15 ENCOUNTER — Other Ambulatory Visit: Payer: Self-pay

## 2022-03-15 ENCOUNTER — Ambulatory Visit
Admission: RE | Admit: 2022-03-15 | Discharge: 2022-03-15 | Disposition: A | Discharge status: Home / Self Care | Payer: BC Managed Care – PPO | Source: Ambulatory Visit | Attending: Radiation Oncology | Admitting: Radiation Oncology

## 2022-03-15 DIAGNOSIS — D0512 Intraductal carcinoma in situ of left breast: Secondary | ICD-10-CM | POA: Diagnosis not present

## 2022-03-15 LAB — RAD ONC ARIA SESSION SUMMARY
Course Elapsed Days: 16
Plan Fractions Treated to Date: 13
Plan Prescribed Dose Per Fraction: 2.67 Gy
Plan Total Fractions Prescribed: 15
Plan Total Prescribed Dose: 40.05 Gy
Reference Point Dosage Given to Date: 34.71 Gy
Reference Point Session Dosage Given: 2.67 Gy
Session Number: 13

## 2022-03-18 ENCOUNTER — Other Ambulatory Visit: Payer: Self-pay

## 2022-03-18 ENCOUNTER — Ambulatory Visit
Admission: RE | Admit: 2022-03-18 | Discharge: 2022-03-18 | Disposition: A | Discharge status: Home / Self Care | Payer: BC Managed Care – PPO | Source: Ambulatory Visit | Attending: Radiation Oncology | Admitting: Radiation Oncology

## 2022-03-18 ENCOUNTER — Ambulatory Visit: Payer: BC Managed Care – PPO | Admitting: Radiation Oncology

## 2022-03-18 DIAGNOSIS — Z510 Encounter for antineoplastic radiation therapy: Secondary | ICD-10-CM | POA: Diagnosis not present

## 2022-03-18 DIAGNOSIS — D0512 Intraductal carcinoma in situ of left breast: Secondary | ICD-10-CM | POA: Diagnosis not present

## 2022-03-18 DIAGNOSIS — Z170 Estrogen receptor positive status [ER+]: Secondary | ICD-10-CM | POA: Diagnosis not present

## 2022-03-18 LAB — RAD ONC ARIA SESSION SUMMARY
Course Elapsed Days: 19
Plan Fractions Treated to Date: 14
Plan Prescribed Dose Per Fraction: 2.67 Gy
Plan Total Fractions Prescribed: 15
Plan Total Prescribed Dose: 40.05 Gy
Reference Point Dosage Given to Date: 37.38 Gy
Reference Point Session Dosage Given: 2.67 Gy
Session Number: 14

## 2022-03-19 ENCOUNTER — Ambulatory Visit
Admission: RE | Admit: 2022-03-19 | Discharge: 2022-03-19 | Disposition: A | Discharge status: Home / Self Care | Payer: BC Managed Care – PPO | Source: Ambulatory Visit | Attending: Radiation Oncology | Admitting: Radiation Oncology

## 2022-03-19 ENCOUNTER — Other Ambulatory Visit: Payer: Self-pay

## 2022-03-19 DIAGNOSIS — Z170 Estrogen receptor positive status [ER+]: Secondary | ICD-10-CM | POA: Diagnosis not present

## 2022-03-19 DIAGNOSIS — Z510 Encounter for antineoplastic radiation therapy: Secondary | ICD-10-CM | POA: Diagnosis not present

## 2022-03-19 DIAGNOSIS — D0512 Intraductal carcinoma in situ of left breast: Secondary | ICD-10-CM | POA: Diagnosis not present

## 2022-03-19 LAB — RAD ONC ARIA SESSION SUMMARY
Course Elapsed Days: 20
Plan Fractions Treated to Date: 15
Plan Prescribed Dose Per Fraction: 2.67 Gy
Plan Total Fractions Prescribed: 15
Plan Total Prescribed Dose: 40.05 Gy
Reference Point Dosage Given to Date: 40.05 Gy
Reference Point Session Dosage Given: 2.67 Gy
Session Number: 15

## 2022-03-20 ENCOUNTER — Other Ambulatory Visit: Payer: Self-pay

## 2022-03-20 ENCOUNTER — Ambulatory Visit
Admission: RE | Admit: 2022-03-20 | Discharge: 2022-03-20 | Disposition: A | Discharge status: Home / Self Care | Payer: BC Managed Care – PPO | Source: Ambulatory Visit | Attending: Radiation Oncology | Admitting: Radiation Oncology

## 2022-03-20 DIAGNOSIS — Z510 Encounter for antineoplastic radiation therapy: Secondary | ICD-10-CM | POA: Diagnosis not present

## 2022-03-20 DIAGNOSIS — D0512 Intraductal carcinoma in situ of left breast: Secondary | ICD-10-CM | POA: Diagnosis not present

## 2022-03-20 DIAGNOSIS — Z170 Estrogen receptor positive status [ER+]: Secondary | ICD-10-CM | POA: Diagnosis not present

## 2022-03-20 LAB — RAD ONC ARIA SESSION SUMMARY
Course Elapsed Days: 21
Plan Fractions Treated to Date: 1
Plan Prescribed Dose Per Fraction: 2 Gy
Plan Total Fractions Prescribed: 5
Plan Total Prescribed Dose: 10 Gy
Reference Point Dosage Given to Date: 42.05 Gy
Reference Point Session Dosage Given: 2 Gy
Session Number: 16

## 2022-03-21 ENCOUNTER — Inpatient Hospital Stay (HOSPITAL_BASED_OUTPATIENT_CLINIC_OR_DEPARTMENT_OTHER): Payer: BC Managed Care – PPO | Admitting: Hematology and Oncology

## 2022-03-21 ENCOUNTER — Other Ambulatory Visit: Payer: Self-pay

## 2022-03-21 ENCOUNTER — Ambulatory Visit
Admission: RE | Admit: 2022-03-21 | Discharge: 2022-03-21 | Disposition: A | Discharge status: Home / Self Care | Payer: BC Managed Care – PPO | Source: Ambulatory Visit | Attending: Radiation Oncology | Admitting: Radiation Oncology

## 2022-03-21 DIAGNOSIS — D0512 Intraductal carcinoma in situ of left breast: Secondary | ICD-10-CM | POA: Insufficient documentation

## 2022-03-21 DIAGNOSIS — Z923 Personal history of irradiation: Secondary | ICD-10-CM | POA: Insufficient documentation

## 2022-03-21 DIAGNOSIS — Z79899 Other long term (current) drug therapy: Secondary | ICD-10-CM | POA: Insufficient documentation

## 2022-03-21 LAB — RAD ONC ARIA SESSION SUMMARY
Course Elapsed Days: 22
Plan Fractions Treated to Date: 2
Plan Prescribed Dose Per Fraction: 2 Gy
Plan Total Fractions Prescribed: 5
Plan Total Prescribed Dose: 10 Gy
Reference Point Dosage Given to Date: 44.05 Gy
Reference Point Session Dosage Given: 2 Gy
Session Number: 17

## 2022-03-21 MED ORDER — TAMOXIFEN CITRATE 20 MG PO TABS: 20.0000 mg | ORAL_TABLET | Freq: Every day | ORAL | 3 refills | Status: DC

## 2022-03-21 NOTE — Assessment & Plan Note (Addendum)
01/17/2022:Left lumpectomy: High-grade DCIS with calcifications spanning 1 cm, resection margins are negative, ER 20%, PR 2% 02/28/2022-03/27/2022: Adjuvant radiation 03/28/2022: Tamoxifen once daily x5 years to start 04/11/2022 (She previously took tamoxifen and had done extremely well)  Tamoxifen counseling: We discussed the risks and benefits of tamoxifen. These include but not limited to insomnia, hot flashes, mood changes, vaginal dryness, and weight gain. Although rare, serious side effects including endometrial cancer, risk of blood clots were also discussed. We strongly believe that the benefits far outweigh the risks. Patient understands these risks and consented to starting treatment. Planned treatment duration is 5 years.  Return to clinic in 3 months for survivorship care plan visit and after that I can see her once a year

## 2022-03-22 ENCOUNTER — Ambulatory Visit
Admission: RE | Admit: 2022-03-22 | Discharge: 2022-03-22 | Disposition: A | Discharge status: Home / Self Care | Payer: BC Managed Care – PPO | Source: Ambulatory Visit | Attending: Radiation Oncology | Admitting: Radiation Oncology

## 2022-03-22 ENCOUNTER — Other Ambulatory Visit: Payer: Self-pay

## 2022-03-22 ENCOUNTER — Telehealth: Payer: Self-pay | Admitting: Hematology and Oncology

## 2022-03-22 DIAGNOSIS — D0512 Intraductal carcinoma in situ of left breast: Secondary | ICD-10-CM | POA: Diagnosis not present

## 2022-03-22 LAB — RAD ONC ARIA SESSION SUMMARY
Course Elapsed Days: 23
Plan Fractions Treated to Date: 3
Plan Prescribed Dose Per Fraction: 2 Gy
Plan Total Fractions Prescribed: 5
Plan Total Prescribed Dose: 10 Gy
Reference Point Dosage Given to Date: 46.05 Gy
Reference Point Session Dosage Given: 2 Gy
Session Number: 18

## 2022-03-22 NOTE — Telephone Encounter (Signed)
Scheduled appointment per 5/25 los. Patient is aware.

## 2022-03-25 DIAGNOSIS — D0512 Intraductal carcinoma in situ of left breast: Secondary | ICD-10-CM | POA: Diagnosis not present

## 2022-03-25 DIAGNOSIS — Z510 Encounter for antineoplastic radiation therapy: Secondary | ICD-10-CM | POA: Diagnosis not present

## 2022-03-25 DIAGNOSIS — Z170 Estrogen receptor positive status [ER+]: Secondary | ICD-10-CM | POA: Diagnosis not present

## 2022-03-26 ENCOUNTER — Ambulatory Visit: Payer: BC Managed Care – PPO | Attending: Radiation Oncology

## 2022-03-26 ENCOUNTER — Ambulatory Visit
Admission: RE | Admit: 2022-03-26 | Discharge: 2022-03-26 | Disposition: A | Discharge status: Home / Self Care | Payer: BC Managed Care – PPO | Source: Ambulatory Visit | Attending: Radiation Oncology | Admitting: Radiation Oncology

## 2022-03-26 ENCOUNTER — Other Ambulatory Visit: Payer: Self-pay

## 2022-03-26 DIAGNOSIS — D0512 Intraductal carcinoma in situ of left breast: Secondary | ICD-10-CM | POA: Insufficient documentation

## 2022-03-26 LAB — RAD ONC ARIA SESSION SUMMARY
Course Elapsed Days: 27
Plan Fractions Treated to Date: 4
Plan Prescribed Dose Per Fraction: 2 Gy
Plan Total Fractions Prescribed: 5
Plan Total Prescribed Dose: 10 Gy
Reference Point Dosage Given to Date: 48.05 Gy
Reference Point Session Dosage Given: 2 Gy
Session Number: 19

## 2022-03-27 ENCOUNTER — Other Ambulatory Visit: Payer: Self-pay

## 2022-03-27 ENCOUNTER — Encounter: Payer: Self-pay | Admitting: *Deleted

## 2022-03-27 ENCOUNTER — Encounter: Payer: Self-pay | Admitting: Radiation Oncology

## 2022-03-27 ENCOUNTER — Ambulatory Visit
Admission: RE | Admit: 2022-03-27 | Discharge: 2022-03-27 | Disposition: A | Discharge status: Home / Self Care | Payer: BC Managed Care – PPO | Source: Ambulatory Visit | Attending: Radiation Oncology | Admitting: Radiation Oncology

## 2022-03-27 DIAGNOSIS — Z170 Estrogen receptor positive status [ER+]: Secondary | ICD-10-CM | POA: Diagnosis not present

## 2022-03-27 DIAGNOSIS — D0512 Intraductal carcinoma in situ of left breast: Secondary | ICD-10-CM | POA: Diagnosis not present

## 2022-03-27 DIAGNOSIS — Z510 Encounter for antineoplastic radiation therapy: Secondary | ICD-10-CM | POA: Diagnosis not present

## 2022-03-27 LAB — RAD ONC ARIA SESSION SUMMARY
Course Elapsed Days: 28
Plan Fractions Treated to Date: 5
Plan Prescribed Dose Per Fraction: 2 Gy
Plan Total Fractions Prescribed: 5
Plan Total Prescribed Dose: 10 Gy
Reference Point Dosage Given to Date: 50.05 Gy
Reference Point Session Dosage Given: 2 Gy
Session Number: 20

## 2022-03-29 DIAGNOSIS — M810 Age-related osteoporosis without current pathological fracture: Secondary | ICD-10-CM | POA: Diagnosis not present

## 2022-03-29 DIAGNOSIS — E785 Hyperlipidemia, unspecified: Secondary | ICD-10-CM | POA: Diagnosis not present

## 2022-03-29 DIAGNOSIS — E119 Type 2 diabetes mellitus without complications: Secondary | ICD-10-CM | POA: Diagnosis not present

## 2022-04-05 DIAGNOSIS — Z1331 Encounter for screening for depression: Secondary | ICD-10-CM | POA: Diagnosis not present

## 2022-04-05 DIAGNOSIS — R82998 Other abnormal findings in urine: Secondary | ICD-10-CM | POA: Diagnosis not present

## 2022-04-05 DIAGNOSIS — Z0000 Encounter for general adult medical examination without abnormal findings: Secondary | ICD-10-CM | POA: Diagnosis not present

## 2022-04-05 DIAGNOSIS — I10 Essential (primary) hypertension: Secondary | ICD-10-CM | POA: Diagnosis not present

## 2022-04-12 ENCOUNTER — Encounter: Payer: Self-pay | Admitting: Radiation Oncology

## 2022-04-23 NOTE — Progress Notes (Signed)
  Patient Name: Andrea [name] MRN: [id number] DOB: Jul 05, 1956 Referring Physician: Serena Croissant (Profile Not Attached) Date of Service: 03/27/2022 [facility], [place]                                                        End Of Treatment Note  Diagnoses: D05.12-Intraductal carcinoma in situ of left breast  Cancer Staging:  S/p lumpectomy: Stage 0 (cTis (DCIS), cN0, cM0) Left Breast, High-grade ductal carcinoma in-situ, ER+ / PR+ / Her2 not assessed  Intent: Curative   Radiation Treatment Dates: 02/27/2022 through 03/27/2022  Left breast, 3D technique, 40.05Gy/15 fractions with 6XFFF photons  Followed by Left breast boost, 3D technique, 10 Gy/5 fractions with 6X photons  Narrative: The patient tolerated radiation therapy relatively well.   Plan: The patient will follow-up with radiation oncology in 59mo . -----------------------------------  Lonie Peak, MD

## 2022-04-26 ENCOUNTER — Ambulatory Visit: Payer: Self-pay | Admitting: Radiation Oncology

## 2022-05-02 NOTE — Progress Notes (Addendum)
Andrea [name] presents today for follow-up after completing radiation to her left breast on 03/27/2022  Pain: Reports occasional achiness/twinge to her breast and nipple, but states it's tolerable Skin: States she didn't experience any peeling after completing radiation. Has a small area of residual hyperpigmentation, but skin is otherwise intact. Confirms she's rubbing vitamin E oil into the breast at bed time ROM: Denies any issues or limitations Lymphedema: Patient denies MedOnc F/U: 06/25/2022 with Annabelle Harman in the [facility] Other issues of note: Reports fatigue has resolved.Overall reports she's doing well and pleased with her continued progress  Pt reports Yes No Comments  Tamoxifen '[x]'$  Started 04/11/2022 '[]'$  Reports lower leg achiness around bedtime; states it resolves during the day  Letrozole '[]'$  '[x]'$    Anastrazole '[]'$  '[x]'$    Mammogram '[x]'$  Date: TBD '[]'$ 

## 2022-05-03 ENCOUNTER — Other Ambulatory Visit: Payer: Self-pay

## 2022-05-03 ENCOUNTER — Ambulatory Visit
Admission: RE | Admit: 2022-05-03 | Discharge: 2022-05-03 | Disposition: A | Discharge status: Home / Self Care | Payer: BC Managed Care – PPO | Source: Ambulatory Visit | Attending: Radiation Oncology | Admitting: Radiation Oncology

## 2022-05-03 VITALS — BP 126/58 | HR 67 | Temp 97.7°F | Resp 20 | Wt 111.0 lb

## 2022-05-03 DIAGNOSIS — Z7984 Long term (current) use of oral hypoglycemic drugs: Secondary | ICD-10-CM | POA: Insufficient documentation

## 2022-05-03 DIAGNOSIS — Z79899 Other long term (current) drug therapy: Secondary | ICD-10-CM | POA: Insufficient documentation

## 2022-05-03 DIAGNOSIS — Z923 Personal history of irradiation: Secondary | ICD-10-CM | POA: Insufficient documentation

## 2022-05-03 DIAGNOSIS — D0512 Intraductal carcinoma in situ of left breast: Secondary | ICD-10-CM | POA: Diagnosis not present

## 2022-05-03 DIAGNOSIS — Z170 Estrogen receptor positive status [ER+]: Secondary | ICD-10-CM | POA: Insufficient documentation

## 2022-05-03 NOTE — Progress Notes (Signed)
Radiation Oncology         [phone] ________________________________  Name: VANISSA STRENGTH MRN: [id number]  Date: 05/03/2022  DOB: 10-20-1956  Follow-Up Visit Note  Outpatient  CC: Prince Solian, MD  Nicholas Lose, MD  Diagnosis and Prior Radiotherapy:    ICD-10-CM   1. Ductal carcinoma in situ (DCIS) of left breast  D05.12        Radiation Treatment Dates: 02/27/2022 through 03/27/2022  Left breast, 3D technique, 40.05Gy/15 fractions with 6XFFF photons  Followed by Left breast boost, 3D technique, 10 Gy/5 fractions with 6X photons  CHIEF COMPLAINT: Here for follow-up and surveillance of DCIS  Narrative:  The patient returns today for routine follow-up.  Nika S Leder presents today for follow-up after completing radiation to her left breast on 03/27/2022  Pain: Reports occasional achiness/twinge to her breast and nipple, but states it's tolerable Skin: States she didn't experience any peeling after completing radiation. Has a small area of residual hyperpigmentation, but skin is otherwise intact. Confirms she's rubbing vitamin E oil into the breast at bed time ROM: Denies any issues or limitations Lymphedema: Patient denies MedOnc F/U: 06/25/2022 with Annabelle Harman in the [facility] Other issues of note: Reports fatigue has resolved.Overall reports she's doing well and pleased with her continued progress  Pt reports Yes No Comments  Tamoxifen '[x]'$  Started 04/11/2022 '[]'$  Reports lower leg achiness around bedtime; states it resolves during the day  Letrozole '[]'$  '[x]'$    Anastrazole '[]'$  '[x]'$    Mammogram '[x]'$  Date: TBD '[]'$                                  ALLERGIES:  is allergic to alendronate sodium, alogliptin, canagliflozin, statins, and telmisartan.  Meds: Current Outpatient Medications  Medication Sig Dispense Refill   metFORMIN (GLUCOPHAGE) 500 MG tablet Take 1 tablet by mouth 2 (two) times daily.     amLODipine (NORVASC) 10 MG tablet Take 1  tablet by mouth daily.     Cholecalciferol (VITAMIN D PO) Take 2,000 Int'l Units by mouth.     COLLAGEN PO Take by mouth.     Cyanocobalamin (VITAMIN B 12) 500 MCG TABS Take 1 tablet by mouth daily.     ezetimibe (ZETIA) 10 MG tablet Take 10 mg by mouth daily.     famotidine (PEPCID) 20 MG tablet Take 20 mg by mouth 2 (two) times daily.     tamoxifen (NOLVADEX) 20 MG tablet Take 1 tablet (20 mg total) by mouth daily. 90 tablet 3   TOPROL XL 100 MG 24 hr tablet Take 1 tablet by mouth daily.     No current facility-administered medications for this encounter.    Physical Findings: The patient is in no acute distress. Patient is alert and oriented.  weight is 111 lb (50.3 kg). Her temperature is 97.7 F (36.5 C). Her blood pressure is 126/58 (abnormal) and her pulse is 67. Her respiration is 20 and oxygen saturation is 97%. .    Satisfactory skin healing in radiotherapy fields.  She still has nipple swelling and inversion on the left.  There is some desquamated dead skin trapped around the inverted nipple which I helped clean with a Q-tip today.  There continues to be some darkening of the nipple areolar complex.   Lab Findings: Lab Results  Component Value Date   WBC 6.0 10/31/2009   HGB 14.8 10/31/2009   HCT 42.6 10/31/2009  MCV 92.7 10/31/2009   PLT 245 10/31/2009    Radiographic Findings: No results found.  Impression/Plan: Healing well from radiotherapy to the breast tissue.  I talked to her about techniques to gently clean out the dead skin around her nipple with a Q-tip while in the shower.  She can also use a fan or cool hair dryer to air out that area.  It does not appear infected.  Continue skin care with topical Vitamin E Oil and / or lotion for at least 2 more months for further healing.  I encouraged her to continue with yearly mammography as appropriate (for intact breast tissue) and followup with medical oncology. I will see her back on an as-needed basis. I have  encouraged her to call if she has any issues or concerns in the future. I wished her the very best.  On date of service, in total, I spent 25 minutes on this encounter. Patient was seen in person.  _____________________________________   Eppie Gibson, MD

## 2022-06-14 ENCOUNTER — Encounter: Payer: Self-pay | Admitting: *Deleted

## 2022-06-25 ENCOUNTER — Inpatient Hospital Stay: Payer: BC Managed Care – PPO | Attending: Adult Health | Admitting: Adult Health

## 2022-06-25 ENCOUNTER — Other Ambulatory Visit: Payer: Self-pay

## 2022-06-25 ENCOUNTER — Encounter: Payer: Self-pay | Admitting: General Practice

## 2022-06-25 VITALS — BP 128/65 | HR 67 | Temp 97.8°F | Resp 16 | Ht <= 58 in | Wt 111.2 lb

## 2022-06-25 DIAGNOSIS — Z170 Estrogen receptor positive status [ER+]: Secondary | ICD-10-CM | POA: Diagnosis not present

## 2022-06-25 DIAGNOSIS — Z79810 Long term (current) use of selective estrogen receptor modulators (SERMs): Secondary | ICD-10-CM | POA: Insufficient documentation

## 2022-06-25 DIAGNOSIS — D0512 Intraductal carcinoma in situ of left breast: Secondary | ICD-10-CM | POA: Insufficient documentation

## 2022-06-25 NOTE — Progress Notes (Signed)
[place] Spiritual Care Note  Spoke with Ms Netzer by phone regarding her interest in assistance with Advance Directives, per referral from LPN. Answered questions and scheduled her for [facility] on 08/05/2022 at 1:30.   [place], [place], [facility] Pager [phone] Voicemail [phone]

## 2022-06-25 NOTE — Progress Notes (Signed)
SURVIVORSHIP VISIT:   BRIEF ONCOLOGIC HISTORY:  Oncology History  Ductal carcinoma in situ (DCIS) of left breast  2010 Miscellaneous   Prior history of right breast cancer treated with lumpectomy, radiation and tamoxifen for 5 years   11/30/2021 Initial Diagnosis   Screening mammogram detected left breast asymmetry and calcifications, on diagnostic mammogram: Indeterminate 5 mm group of calcifications retroareolar left breast (DCIS high-grade ER 20%, PR 2%, indeterminate 9 mm calcifications UOQ left breast (fibroadenoma with dystrophic calcs),   01/17/2022 Surgery   Left lumpectomy: High-grade DCIS with calcifications spanning 1 cm, resection margins are negative, ER 20%, PR 2%   02/27/2022 - 03/27/2022 Radiation Therapy   Left breast, 3D technique, 40.05Gy/15 fractions with 6XFFF photons   Followed by Left breast boost, 3D technique, 10 Gy/5 fractions with 6X photons   03/2022 -  Anti-estrogen oral therapy   Tamoxifen x 5 years     INTERVAL HISTORY:  Ms. Lio to review her survivorship care plan detailing her treatment course for breast cancer, as well as monitoring long-term side effects of that treatment, education regarding health maintenance, screening, and overall wellness and health promotion.     Overall, Ms. Sparr reports feeling quite well.  She is taking tamoxifen daily and is tolerating it moderately well.  She does have occasional leg pain at night.  She also noted joint pain in the left hip that started 2 days ago however she took Tylenol for this pain and it resolved.  REVIEW OF SYSTEMS:  Review of Systems  Constitutional:  Negative for appetite change, chills, fatigue, fever and unexpected weight change.  HENT:   Negative for hearing loss, lump/mass and trouble swallowing.   Eyes:  Negative for eye problems and icterus.  Respiratory:  Negative for chest tightness, cough and shortness of breath.   Cardiovascular:  Negative for chest pain, leg swelling and  palpitations.  Gastrointestinal:  Negative for abdominal distention, abdominal pain, constipation, diarrhea, nausea and vomiting.  Endocrine: Negative for hot flashes.  Genitourinary:  Negative for difficulty urinating.   Musculoskeletal:  Negative for arthralgias.  Skin:  Negative for itching and rash.  Neurological:  Negative for dizziness, extremity weakness, headaches and numbness.  Hematological:  Negative for adenopathy. Does not bruise/bleed easily.  Psychiatric/Behavioral:  Negative for depression. The patient is not nervous/anxious.    Breast: Denies any new nodularity, masses, tenderness, nipple changes, or nipple discharge.      ONCOLOGY TREATMENT TEAM:  1. Surgeon:  Dr. Donne Hazel at [facility] 2. Medical Oncologist: Dr. Lindi Adie  3. Radiation Oncologist: Dr. Isidore Moos    PAST MEDICAL/SURGICAL HISTORY:  Past Medical History:  Diagnosis Date   Cancer [facility]) 2005   breast cancer, right   Diabetes mellitus without complication ([place])    GERD (gastroesophageal reflux disease)    Hypercholesteremia    Hypertension    Osteoporosis    Personal history of radiation therapy 2005   Right Breast Cancer   Urinary incontinence    Past Surgical History:  Procedure Laterality Date   BREAST LUMPECTOMY Right 2005   BREAST LUMPECTOMY WITH RADIOACTIVE SEED LOCALIZATION Left 01/17/2022   Procedure: LEFT BREAST LUMPECTOMY WITH RADIOACTIVE SEED LOCALIZATION;  Surgeon: Rolm Bookbinder, MD;  Location: [facility];  Service: General;  Laterality: Left;   BREAST SURGERY Right 2005   lumpectomy   CESAREAN SECTION  1991     ALLERGIES:  Allergies  Allergen Reactions   Alendronate Sodium Other (See Comments)   Statins Other (See Comments)  Telmisartan Other (See Comments)    Dizziness, change in gait     CURRENT MEDICATIONS:  Outpatient Encounter Medications as of 06/25/2022  Medication Sig   amLODipine (NORVASC) 10 MG tablet Take 1 tablet by mouth  daily.   Cholecalciferol (VITAMIN D PO) Take 2,000 Int'l Units by mouth.   COLLAGEN PO Take by mouth.   Cyanocobalamin (VITAMIN B 12) 500 MCG TABS Take 1 tablet by mouth daily.   ezetimibe (ZETIA) 10 MG tablet Take 10 mg by mouth daily.   famotidine (PEPCID) 20 MG tablet Take 20 mg by mouth 2 (two) times daily.   metFORMIN (GLUCOPHAGE) 500 MG tablet Take 1 tablet by mouth 2 (two) times daily.   tamoxifen (NOLVADEX) 20 MG tablet Take 1 tablet (20 mg total) by mouth daily.   TOPROL XL 100 MG 24 hr tablet Take 1 tablet by mouth daily.   No facility-administered encounter medications on file as of 06/25/2022.     ONCOLOGIC FAMILY HISTORY:  Family History  Problem Relation Age of Onset   Heart disease Mother    Diabetes Father    Breast cancer Sister 60       bilateral breast cancer   Cancer Sister    Hypertension Brother       SOCIAL HISTORY:  Social History   Socioeconomic History   Marital status: Divorced    Spouse name: Not on file   Number of children: Not on file   Years of education: Not on file   Highest education level: Not on file  Occupational History   Not on file  Tobacco Use   Smoking status: Never   Smokeless tobacco: Never  Vaping Use   Vaping Use: Never used  Substance and Sexual Activity   Alcohol use: No    Alcohol/week: 0.0 standard drinks of alcohol   Drug use: No   Sexual activity: Not Currently    Partners: Male    Birth control/protection: Post-menopausal, Abstinence  Other Topics Concern   Not on file  Social History Narrative   Not on file   Social Determinants of Health   Financial Resource Strain: Not on file  Food Insecurity: Not on file  Transportation Needs: Not on file  Physical Activity: Not on file  Stress: Not on file  Social Connections: Not on file  Intimate Partner Violence: Not on file     OBSERVATIONS/OBJECTIVE:  BP 128/65 (BP Location: Left Arm, Patient Position: Sitting)   Pulse 67   Temp 97.8 F (36.6 C)  (Temporal)   Resp 16   Ht '4\' 9"'$  (1.448 m)   Wt 111 lb 3.2 oz (50.4 kg)   LMP 06/28/2010   SpO2 99%   BMI 24.06 kg/m  GENERAL: Patient is a well appearing female in no acute distress HEENT:  Sclerae anicteric.  Oropharynx clear and moist. No ulcerations or evidence of oropharyngeal candidiasis. Neck is supple.  NODES:  No cervical, supraclavicular, or axillary lymphadenopathy palpated.  BREAST EXAM: Left breast status postlumpectomy and radiation no sign of local recurrence right breast is benign. LUNGS:  Clear to auscultation bilaterally.  No wheezes or rhonchi. HEART:  Regular rate and rhythm. No murmur appreciated. ABDOMEN:  Soft, nontender.  Positive, normoactive bowel sounds. No organomegaly palpated. MSK:  No focal spinal tenderness to palpation. Full range of motion bilaterally in the upper extremities. EXTREMITIES:  No peripheral edema.   SKIN:  Clear with no obvious rashes or skin changes. No nail dyscrasia. NEURO:  Nonfocal. Well oriented.  Appropriate affect.   LABORATORY DATA:  None for this visit.  DIAGNOSTIC IMAGING:  None for this visit.      ASSESSMENT AND PLAN:  Ms.. Ground is a pleasant 66 y.o. female with Stage 0 left breast invasive ductal carcinoma, ER+/PR-, diagnosed in 12/2021, treated with lumpectomy, adjuvant radiation therapy, and anti-estrogen therapy with Tamoxifen beginning in 12/2021.  She presents to the Survivorship Clinic for our initial meeting and routine follow-up post-completion of treatment for breast cancer.    1. Stage 0 left breast cancer:  Ms. Castor is continuing to recover from definitive treatment for breast cancer. She will follow-up with her medical oncologist, Dr. Lindi Adie in 6 months with history and physical exam per surveillance protocol.  She will continue her anti-estrogen therapy with tamoxifen. Thus far, she is tolerating the tamoxifen well, with minimal side effects. She was instructed to make Dr. Lindi Adie or myself aware if she  begins to experience any worsening side effects of the medication and I could see her back in clinic to help manage those side effects, as needed. Her mammogram is due January 2024; orders placed today. Today, a comprehensive survivorship care plan and treatment summary was reviewed with the patient today detailing her breast cancer diagnosis, treatment course, potential late/long-term effects of treatment, appropriate follow-up care with recommendations for the future, and patient education resources.  A copy of this summary, along with a letter will be sent to the patient's primary care provider via mail/fax/In Basket message after today's visit.    2. Bone health: Currently Dr. Gabriel Carina following her bone density testing and will continue to do so.  If we can ever be of assistance in ordering this testing in the future we are happy to do so.  She was given education on specific activities to promote bone health.  3. Cancer screening:  Due to Ms. Flom's history and her age, she should receive screening for skin cancers, colon cancer, and gynecologic cancers.  The information and recommendations are listed on the patient's comprehensive care plan/treatment summary and were reviewed in detail with the patient.    4. Health maintenance and wellness promotion: Ms. Passe was encouraged to consume 5-7 servings of fruits and vegetables per day. We reviewed the "Nutrition Rainbow" handout.  She was also encouraged to engage in moderate to vigorous exercise for 30 minutes per day most days of the week. We discussed the [employer] fitness program, which is designed for cancer survivors to help them become more physically fit after cancer treatments.  She was instructed to limit her alcohol consumption and continue to abstain from tobacco use.     5. Support services/counseling: It is not uncommon for this period of the patient's cancer care trajectory to be one of many emotions and stressors.   She was  given information regarding our available services and encouraged to contact me with any questions or for help enrolling in any of our support group/programs.    Follow up instructions:    -Return to cancer center in 6 months for follow-up with Dr. Lindi Adie -Mammogram due in January 2024 -Follow up with surgery 1 year -She is welcome to return back to the Survivorship Clinic at any time; no additional follow-up needed at this time.  -Consider referral back to survivorship as a long-term survivor for continued surveillance  The patient was provided an opportunity to ask questions and all were answered. The patient agreed with the plan and demonstrated an understanding of the instructions.   Total  encounter time:50 minutes*in face-to-face visit time, chart review, lab review, care coordination, order entry, and documentation of the encounter time.    Wilber Bihari, NP 06/25/22 9:44 AM Medical Oncology and Hematology [facility] [place], [place] [address] Tel. [phone]    Fax. [phone]  *Total Encounter Time as defined by the Centers for Medicare and Medicaid Services includes, in addition to the face-to-face time of a patient visit (documented in the note above) non-face-to-face time: obtaining and reviewing outside history, ordering and reviewing medications, tests or procedures, care coordination (communications with other health care professionals or caregivers) and documentation in the medical record.

## 2022-08-05 ENCOUNTER — Inpatient Hospital Stay: Payer: BC Managed Care – PPO | Attending: Adult Health | Admitting: Licensed Clinical Social Worker

## 2022-08-05 ENCOUNTER — Other Ambulatory Visit: Payer: Self-pay

## 2022-08-05 NOTE — Progress Notes (Signed)
[place] Social Work  Patient presented to [facility]  to review and complete healthcare advance directives.  Clinical Social Worker met with patient.  The patient designated Andrea [name] (daughter) as their primary healthcare agent and Victoriano Kollins Fenter as their secondary agent.  Patient also completed healthcare living will.    Documents were notarized and copies made for patient/family. Clinical Social Worker will send documents to medical records to be scanned into patient's chart. Clinical Social Worker encouraged patient/family to contact with any additional questions or concerns.   [place], [place] Worker [employer]

## 2022-10-28 DIAGNOSIS — R87618 Other abnormal cytological findings on specimens from cervix uteri: Secondary | ICD-10-CM

## 2022-10-28 HISTORY — DX: Other abnormal cytological findings on specimens from cervix uteri: R87.618

## 2022-11-14 ENCOUNTER — Ambulatory Visit
Admission: RE | Admit: 2022-11-14 | Discharge: 2022-11-14 | Disposition: A | Discharge status: Home / Self Care | Payer: BC Managed Care – PPO | Source: Ambulatory Visit | Attending: Adult Health | Admitting: Adult Health

## 2022-11-14 DIAGNOSIS — Z853 Personal history of malignant neoplasm of breast: Secondary | ICD-10-CM | POA: Diagnosis not present

## 2022-11-14 DIAGNOSIS — R922 Inconclusive mammogram: Secondary | ICD-10-CM | POA: Diagnosis not present

## 2022-11-14 DIAGNOSIS — D0512 Intraductal carcinoma in situ of left breast: Secondary | ICD-10-CM

## 2022-11-18 NOTE — Progress Notes (Signed)
67 y.o. G60P1001 Divorced Asian female here for annual exam.    Back on Tamoxifen following DCIS tx of left breast.   No change in bladder function or control.  Has mixed incontinence.   Granddaughter is 70 months old.   PCP:   Dr. Dagmar Hait  Patient's last menstrual period was 06/28/2010.           Sexually active: No.  The current method of family planning is post menopausal status.    Exercising: Yes.     Walking. Smoker:  no  Health Maintenance: Pap:  06/17/18 neg: HR HPV neg,04-28-15 Neg:Neg HR HPV  02/07/11 neg History of abnormal Pap:  yes, hx of condyloma MMG:  11/14/22 Breast Density category C, BI-RADS CATEGORY 2 Benign Colonoscopy:  03/13/10, 2021 cologuard neg - she will do through her PCP. BMD:   05/05/20  Result  osteoporotic.  PCP following.  TDaP:  09/01/19 Gardasil:   no HIV: 09/01/19 Neg Hep C: 09/01/19 neg Screening Labs:  PCP   reports that she has never smoked. She has never used smokeless tobacco. She reports that she does not drink alcohol and does not use drugs.  Past Medical History:  Diagnosis Date   Cancer [facility]) 2005   breast cancer, right   Diabetes mellitus without complication ([place])    GERD (gastroesophageal reflux disease)    Hypercholesteremia    Hypertension    Osteoporosis    Personal history of radiation therapy 2005   Right Breast Cancer   Urinary incontinence     Past Surgical History:  Procedure Laterality Date   BREAST LUMPECTOMY Right 2005   BREAST LUMPECTOMY WITH RADIOACTIVE SEED LOCALIZATION Left 01/17/2022   Procedure: LEFT BREAST LUMPECTOMY WITH RADIOACTIVE SEED LOCALIZATION;  Surgeon: Rolm Bookbinder, MD;  Location: [place];  Service: General;  Laterality: Left;   BREAST SURGERY Right 2005   lumpectomy   CESAREAN SECTION  1991    Current Outpatient Medications  Medication Sig Dispense Refill   amLODipine (NORVASC) 10 MG tablet Take 1 tablet by mouth daily.     Cholecalciferol (VITAMIN D PO) Take 2,000 Int'l  Units by mouth.     COLLAGEN PO Take by mouth.     Cyanocobalamin (VITAMIN B 12) 500 MCG TABS Take 1 tablet by mouth daily.     ezetimibe (ZETIA) 10 MG tablet Take 10 mg by mouth daily.     famotidine (PEPCID) 20 MG tablet Take 20 mg by mouth 2 (two) times daily.     metFORMIN (GLUCOPHAGE) 500 MG tablet Take 1 tablet by mouth 2 (two) times daily.     tamoxifen (NOLVADEX) 20 MG tablet Take 1 tablet (20 mg total) by mouth daily. 90 tablet 3   TOPROL XL 100 MG 24 hr tablet Take 1 tablet by mouth daily.     No current facility-administered medications for this visit.    Family History  Problem Relation Age of Onset   Heart disease Mother    Diabetes Father    Breast cancer Sister 13       bilateral breast cancer   Cancer Sister    Hypertension Brother     Review of Systems  All other systems reviewed and are negative.   Exam:   BP 114/72 (BP Location: Right Arm, Patient Position: Sitting, Cuff Size: Normal)   Pulse 73   Ht 4' 9.5" (1.461 m)   Wt 105 lb (47.6 kg)   LMP 06/28/2010   SpO2 99%   BMI 22.33  kg/m     General appearance: alert, cooperative and appears stated age Head: normocephalic, without obvious abnormality, atraumatic Neck: no adenopathy, supple, symmetrical, trachea midline and thyroid normal to inspection and palpation Lungs: clear to auscultation bilaterally Breasts: bilateral scars, no masses or tenderness, No nipple retraction or dimpling, No nipple discharge or bleeding, No axillary adenopathy Heart: regular rate and rhythm Abdomen: soft, non-tender; no masses, no organomegaly Extremities: extremities normal, atraumatic, no cyanosis or edema Skin: skin color, texture, turgor normal. No rashes or lesions Lymph nodes: cervical, supraclavicular, and axillary nodes normal. Neurologic: grossly normal  Pelvic: External genitalia:  no lesions              No abnormal inguinal nodes palpated.              Urethra:  normal appearing urethra with no masses,  tenderness or lesions              Bartholins and Skenes: normal                 Vagina: normal appearing vagina with normal color and discharge, no lesions              Cervix: no lesions              Pap taken: yes Bimanual Exam:  Uterus:  normal size, contour, position, consistency, mobility, non-tender              Adnexa: no mass, fullness, tenderness              Rectal exam: yes.  Confirms.              Anus:  normal sphincter tone, no lesions  Chaperone was present for exam:  Raquel Sarna  Assessment:   Well woman visit with gynecologic exam. Remote hx of condyloma.  Hx right breast cancer.  Status post lumpectomy and XRT. Hx left breast DCIS.   Status post lumpectomy and XRT. On Tamoxifen.  Osteoporosis.  Off Prolia. Mixed incontinence.   Plan: Mammogram screening discussed. Self breast awareness reviewed. Pap and HR HPV collected Guidelines for Calcium, Vitamin D, regular exercise program including cardiovascular and weight bearing exercise. BMD this year through PCP.  I discussed referral to Dr. Sherlene Shams if she is considering surgical care for her stress incontinence.   Follow up annually and prn.   After visit summary provided.

## 2022-11-28 ENCOUNTER — Ambulatory Visit: Payer: BC Managed Care – PPO | Admitting: Obstetrics and Gynecology

## 2022-12-02 ENCOUNTER — Encounter: Payer: Self-pay | Admitting: Obstetrics and Gynecology

## 2022-12-02 ENCOUNTER — Ambulatory Visit (INDEPENDENT_AMBULATORY_CARE_PROVIDER_SITE_OTHER): Payer: BC Managed Care – PPO | Admitting: Obstetrics and Gynecology

## 2022-12-02 ENCOUNTER — Other Ambulatory Visit (HOSPITAL_COMMUNITY)
Admission: RE | Admit: 2022-12-02 | Discharge: 2022-12-02 | Disposition: A | Discharge status: Home / Self Care | Payer: BC Managed Care – PPO | Source: Ambulatory Visit | Attending: Obstetrics and Gynecology | Admitting: Obstetrics and Gynecology

## 2022-12-02 VITALS — BP 114/72 | HR 73 | Ht <= 58 in | Wt 105.0 lb

## 2022-12-02 DIAGNOSIS — Z01419 Encounter for gynecological examination (general) (routine) without abnormal findings: Secondary | ICD-10-CM | POA: Diagnosis not present

## 2022-12-02 DIAGNOSIS — Z124 Encounter for screening for malignant neoplasm of cervix: Secondary | ICD-10-CM | POA: Diagnosis not present

## 2022-12-02 NOTE — Patient Instructions (Signed)

## 2022-12-03 ENCOUNTER — Encounter: Payer: Self-pay | Admitting: Obstetrics and Gynecology

## 2022-12-05 LAB — CYTOLOGY - PAP
Comment: NEGATIVE
Diagnosis: NEGATIVE
Diagnosis: REACTIVE
High risk HPV: NEGATIVE

## 2022-12-11 ENCOUNTER — Other Ambulatory Visit: Payer: Self-pay

## 2022-12-11 DIAGNOSIS — R87618 Other abnormal cytological findings on specimens from cervix uteri: Secondary | ICD-10-CM

## 2022-12-15 ENCOUNTER — Telehealth: Payer: Self-pay | Admitting: Obstetrics and Gynecology

## 2022-12-15 ENCOUNTER — Encounter: Payer: Self-pay | Admitting: Obstetrics and Gynecology

## 2022-12-25 NOTE — Progress Notes (Signed)
Patient Care Team: Prince Solian, MD as PCP - General (Internal Medicine) Eppie Gibson, MD as Attending Physician (Radiation Oncology) Nicholas Lose, MD as Consulting Physician (Hematology and Oncology) Rolm Bookbinder, MD as Consulting Physician (General Surgery)  DIAGNOSIS: No diagnosis found.  SUMMARY OF ONCOLOGIC HISTORY: Oncology History  Ductal carcinoma in situ (DCIS) of left breast  2010 Miscellaneous   Prior history of right breast cancer treated with lumpectomy, radiation and tamoxifen for 5 years   11/30/2021 Initial Diagnosis   Screening mammogram detected left breast asymmetry and calcifications, on diagnostic mammogram: Indeterminate 5 mm group of calcifications retroareolar left breast (DCIS high-grade ER 20%, PR 2%, indeterminate 9 mm calcifications UOQ left breast (fibroadenoma with dystrophic calcs),   01/17/2022 Surgery   Left lumpectomy: High-grade DCIS with calcifications spanning 1 cm, resection margins are negative, ER 20%, PR 2%   01/17/2022 Cancer Staging   Staging form: Breast, AJCC 8th Edition - Pathologic stage from 01/17/2022: Stage 0 (pTis (DCIS), pN0, cM0, ER+, PR-, HER2-) - Signed by Gardenia Phlegm, NP on 06/25/2022 Stage prefix: Initial diagnosis   02/27/2022 - 03/27/2022 Radiation Therapy   Left breast, 3D technique, 40.05Gy/15 fractions with 6XFFF photons   Followed by Left breast boost, 3D technique, 10 Gy/5 fractions with 6X photons   03/2022 -  Anti-estrogen oral therapy   Tamoxifen x 5 years     CHIEF COMPLIANT: DCIS left breast   INTERVAL HISTORY: Andrea [name] is a 67 y.o. female is here because of recent diagnosis of left breast DCIS. She presents to the clinic for a follow-up.     ALLERGIES:  is allergic to alendronate sodium, statins, and telmisartan.  MEDICATIONS:  Current Outpatient Medications  Medication Sig Dispense Refill   amLODipine (NORVASC) 10 MG tablet Take 1 tablet by mouth daily.     Cholecalciferol  (VITAMIN D PO) Take 2,000 Int'l Units by mouth.     COLLAGEN PO Take by mouth.     Cyanocobalamin (VITAMIN B 12) 500 MCG TABS Take 1 tablet by mouth daily.     ezetimibe (ZETIA) 10 MG tablet Take 10 mg by mouth daily.     famotidine (PEPCID) 20 MG tablet Take 20 mg by mouth 2 (two) times daily.     metFORMIN (GLUCOPHAGE) 500 MG tablet Take 1 tablet by mouth 2 (two) times daily.     tamoxifen (NOLVADEX) 20 MG tablet Take 1 tablet (20 mg total) by mouth daily. 90 tablet 3   TOPROL XL 100 MG 24 hr tablet Take 1 tablet by mouth daily.     No current facility-administered medications for this visit.    PHYSICAL EXAMINATION: ECOG PERFORMANCE STATUS: {CHL ONC ECOG PS:[id number]}  There were no vitals filed for this visit. There were no vitals filed for this visit.  BREAST:*** No palpable masses or nodules in either right or left breasts. No palpable axillary supraclavicular or infraclavicular adenopathy no breast tenderness or nipple discharge. (exam performed in the presence of a chaperone)  LABORATORY DATA:  I have reviewed the data as listed    Latest Ref Rng & Units 01/11/2022    1:00 PM 10/31/2009    1:50 PM 03/15/2009    8:58 AM  CMP  Glucose 70 - 99 mg/dL 191  100  120   BUN 8 - 23 mg/dL '11  15  13   '$ Creatinine 0.44 - 1.00 mg/dL 0.46  0.45  0.51   Sodium 135 - 145 mmol/L 137  141  140  Potassium 3.5 - 5.1 mmol/L 4.2  4.0  4.1   Chloride 98 - 111 mmol/L 101  104  106   CO2 22 - 32 mmol/L '25  27  24   '$ Calcium 8.9 - 10.3 mg/dL 9.3  9.2  9.3   Total Protein 6.0 - 8.3 g/dL  6.8  6.9   Total Bilirubin 0.3 - 1.2 mg/dL  0.3  0.4   Alkaline Phos 39 - 117 U/L  66  64   AST 0 - 37 U/L  22  22   ALT 0 - 35 U/L  19  18     Lab Results  Component Value Date   WBC 6.0 10/31/2009   HGB 14.8 10/31/2009   HCT 42.6 10/31/2009   MCV 92.7 10/31/2009   PLT 245 10/31/2009   NEUTROABS 3.8 10/31/2009    ASSESSMENT & PLAN:  No problem-specific Assessment & Plan notes found for this  encounter.    No orders of the defined types were placed in this encounter.  The patient has a good understanding of the overall plan. she agrees with it. she will call with any problems that may develop before the next visit here. Total time spent: 30 mins including face to face time and time spent for planning, charting and co-ordination of care   Suzzette Righter, [place] 12/25/22    I Gardiner Coins am acting as a Education administrator for [employer]  ***

## 2022-12-26 ENCOUNTER — Inpatient Hospital Stay: Payer: BC Managed Care – PPO | Attending: Hematology and Oncology | Admitting: Hematology and Oncology

## 2022-12-26 ENCOUNTER — Encounter: Payer: Self-pay | Admitting: *Deleted

## 2022-12-26 VITALS — BP 137/59 | HR 70 | Temp 97.9°F | Resp 18 | Ht <= 58 in | Wt 106.5 lb

## 2022-12-26 DIAGNOSIS — Z923 Personal history of irradiation: Secondary | ICD-10-CM | POA: Diagnosis not present

## 2022-12-26 DIAGNOSIS — D0512 Intraductal carcinoma in situ of left breast: Secondary | ICD-10-CM | POA: Insufficient documentation

## 2022-12-26 DIAGNOSIS — Z170 Estrogen receptor positive status [ER+]: Secondary | ICD-10-CM | POA: Insufficient documentation

## 2022-12-26 DIAGNOSIS — Z79810 Long term (current) use of selective estrogen receptor modulators (SERMs): Secondary | ICD-10-CM | POA: Diagnosis not present

## 2022-12-26 NOTE — Assessment & Plan Note (Addendum)
01/17/2022:Left lumpectomy: High-grade DCIS with calcifications spanning 1 cm, resection margins are negative, ER 20%, PR 2% 02/28/2022-03/27/2022: Adjuvant radiation 03/28/2022: Tamoxifen once daily x5 years started 04/11/2022 (She previously took tamoxifen and had done extremely well)   Tamoxifen toxicities: Tolerating it fairly well. She is undergoing endometrial biopsy by her gynecology because of an abnormal Pap test.  Breast cancer surveillance: Mammogram 11/14/2022: Benign breast density category C  Return to clinic in 1 year for follow-up

## 2022-12-27 ENCOUNTER — Telehealth: Payer: Self-pay | Admitting: Hematology and Oncology

## 2022-12-27 NOTE — Telephone Encounter (Signed)
Called patient per 2/29 los notes to schedule f/u. Patient scheduled and notified.

## 2022-12-31 NOTE — Telephone Encounter (Signed)
Opened in error

## 2023-01-06 NOTE — Progress Notes (Signed)
GYNECOLOGY  VISIT   HPI: 67 y.o.   Divorced  Asian  female   [place] with Patient's last menstrual period was 06/28/2010.   here for   endo bx  Has endometrial cells on her pap done 12/02/22.  Taking Tamoxifen.   No vaginal bleeding.   Has pelvic US on 01/30/23.   GYNECOLOGIC HISTORY: Patient's last menstrual period was 06/28/2010. Contraception:  PMP Menopausal hormone therapy:  n/a Last mammogram:  11/14/22 Breast Density category C, BI-RADS CATEGORY 2 Benign  Last pap smear:   12/02/22 neg: HR HPV neg,  06/17/18 neg: HR HPV neg        OB History     Gravida  1   Para  1   Term  1   Preterm      AB      Living  1      SAB      IAB      Ectopic      Multiple      Live Births                 Patient Active Problem List   Diagnosis Date Noted   Ductal carcinoma in situ (DCIS) of left breast 12/28/2021   History of breast cancer 06/24/2014   Malignant neoplasm of breast (female), unspecified site 06/24/2014   Genuine stress incontinence, female 04/18/2014   Overactive bladder 04/18/2014    Past Medical History:  Diagnosis Date   Cancer ([place]) 2005   breast cancer, right   Diabetes mellitus without complication ([place])    GERD (gastroesophageal reflux disease)    Hypercholesteremia    Hypertension    Osteoporosis    Personal history of radiation therapy 2005   Right Breast Cancer   Unexplained endometrial cells on cervical Pap smear 2024   Urinary incontinence     Past Surgical History:  Procedure Laterality Date   BREAST LUMPECTOMY Right 2005   BREAST LUMPECTOMY WITH RADIOACTIVE SEED LOCALIZATION Left 01/17/2022   Procedure: LEFT BREAST LUMPECTOMY WITH RADIOACTIVE SEED LOCALIZATION;  Surgeon: Rolm Bookbinder, MD;  Location: [place];  Service: General;  Laterality: Left;   BREAST SURGERY Right 2005   lumpectomy   CESAREAN SECTION  1991    Current Outpatient Medications  Medication Sig Dispense Refill   Acetaminophen  (TYLENOL 8 HOUR PO) Take by mouth daily as needed.     amLODipine (NORVASC) 10 MG tablet Take 1 tablet by mouth daily.     calcium carbonate (TUMS EX) 750 MG chewable tablet Chew 1 tablet by mouth daily as needed.     Cholecalciferol (VITAMIN D PO) Take 2,000 Int'l Units by mouth.     Cyanocobalamin (VITAMIN B 12) 500 MCG TABS Take 1 tablet by mouth daily.     ezetimibe (ZETIA) 10 MG tablet Take 10 mg by mouth daily.     famotidine (PEPCID) 20 MG tablet Take 20 mg by mouth 2 (two) times daily.     metFORMIN (GLUCOPHAGE) 500 MG tablet Take 1 tablet by mouth 2 (two) times daily.     tamoxifen (NOLVADEX) 20 MG tablet Take 1 tablet (20 mg total) by mouth daily. 90 tablet 3   TOPROL XL 100 MG 24 hr tablet Take 1 tablet by mouth daily.     COLLAGEN PO Take by mouth. (Patient not taking: Reported on 01/20/2023)     No current facility-administered medications for this visit.     ALLERGIES: Alendronate sodium, Statins, and Telmisartan  Family History  Problem Relation Age of Onset   Heart disease Mother    Diabetes Father    Breast cancer Sister 60       bilateral breast cancer   Cancer Sister    Hypertension Brother     Social History   Socioeconomic History   Marital status: Divorced    Spouse name: Not on file   Number of children: Not on file   Years of education: Not on file   Highest education level: Not on file  Occupational History   Not on file  Tobacco Use   Smoking status: Never   Smokeless tobacco: Never  Vaping Use   Vaping Use: Never used  Substance and Sexual Activity   Alcohol use: No    Alcohol/week: 0.0 standard drinks of alcohol   Drug use: No   Sexual activity: Not Currently    Partners: Male    Birth control/protection: Post-menopausal, Abstinence  Other Topics Concern   Not on file  Social History Narrative   Not on file   Social Determinants of Health   Financial Resource Strain: Not on file  Food Insecurity: Not on file  Transportation Needs:  Not on file  Physical Activity: Not on file  Stress: Not on file  Social Connections: Not on file  Intimate Partner Violence: Not on file    Review of Systems  All other systems reviewed and are negative.   PHYSICAL EXAMINATION:    BP 122/82 (BP Location: Left Arm, Patient Position: Sitting, Cuff Size: Normal)   Pulse 74   Ht 4' 9.5" (1.461 m)   Wt 104 lb (47.2 kg)   LMP 06/28/2010   SpO2 97%   BMI 22.12 kg/m     General appearance: alert, cooperative and appears stated age  EMB: Consent for procedure. Sterile prep with   Paracervical block with 1% lidocaine 6 cc, lot number [id number], expiration 04/2025 Tenaculum to anterior cervical lip. Pipelle passed to   6 cm twice.   Tissue to pathology.  Minimal EBL. No complications.    Chaperone was present for exam:  Emily  ASSESSMENT  Endometrial cells on pap.  Use of Tamoxifen for left breast DCIS.   PLAN  We discussed potential effect of Tamoxifen on the endometrium:  polyps, hyperplasia, and cancer. Treatment of endometrial cancer discussed:  hysterectomy with bilateral salpingo-oophorectomy and lymph node dissection.  FU EMB.  Post biopsy precautions given.  Keep pelvic US appointment.   20 min  total time was spent for this patient encounter, including preparation, face-to-face counseling with the patient, coordination of care, and documentation of the encounter.

## 2023-01-16 NOTE — Progress Notes (Deleted)
GYNECOLOGY  VISIT   HPI: 67 y.o.   Divorced  Asian  female   [place] with Patient's last menstrual period was 06/28/2010.   here for   U/S consult  GYNECOLOGIC HISTORY: Patient's last menstrual period was 06/28/2010. Contraception:  PMP Menopausal hormone therapy:  n/a Last mammogram:  11/14/22 Breast Density Category C, BI-RADS CATEGORY 2 benign Last pap smear:   12/02/22 neg: HR HPV neg, 06/17/18 neg: HR HPV neg        OB History     Gravida  1   Para  1   Term  1   Preterm      AB      Living  1      SAB      IAB      Ectopic      Multiple      Live Births                 Patient Active Problem List   Diagnosis Date Noted   Ductal carcinoma in situ (DCIS) of left breast 12/28/2021   History of breast cancer 06/24/2014   Malignant neoplasm of breast (female), unspecified site 06/24/2014   Genuine stress incontinence, female 04/18/2014   Overactive bladder 04/18/2014    Past Medical History:  Diagnosis Date   Cancer ([place]) 2005   breast cancer, right   Diabetes mellitus without complication ([place])    GERD (gastroesophageal reflux disease)    Hypercholesteremia    Hypertension    Osteoporosis    Personal history of radiation therapy 2005   Right Breast Cancer   Unexplained endometrial cells on cervical Pap smear 2024   Urinary incontinence     Past Surgical History:  Procedure Laterality Date   BREAST LUMPECTOMY Right 2005   BREAST LUMPECTOMY WITH RADIOACTIVE SEED LOCALIZATION Left 01/17/2022   Procedure: LEFT BREAST LUMPECTOMY WITH RADIOACTIVE SEED LOCALIZATION;  Surgeon: Rolm Bookbinder, MD;  Location: [place];  Service: General;  Laterality: Left;   BREAST SURGERY Right 2005   lumpectomy   CESAREAN SECTION  1991    Current Outpatient Medications  Medication Sig Dispense Refill   amLODipine (NORVASC) 10 MG tablet Take 1 tablet by mouth daily.     Cholecalciferol (VITAMIN D PO) Take 2,000 Int'l Units by mouth.      COLLAGEN PO Take by mouth.     Cyanocobalamin (VITAMIN B 12) 500 MCG TABS Take 1 tablet by mouth daily.     ezetimibe (ZETIA) 10 MG tablet Take 10 mg by mouth daily.     famotidine (PEPCID) 20 MG tablet Take 20 mg by mouth 2 (two) times daily.     metFORMIN (GLUCOPHAGE) 500 MG tablet Take 1 tablet by mouth 2 (two) times daily.     tamoxifen (NOLVADEX) 20 MG tablet Take 1 tablet (20 mg total) by mouth daily. 90 tablet 3   TOPROL XL 100 MG 24 hr tablet Take 1 tablet by mouth daily.     No current facility-administered medications for this visit.     ALLERGIES: Alendronate sodium, Statins, and Telmisartan  Family History  Problem Relation Age of Onset   Heart disease Mother    Diabetes Father    Breast cancer Sister 61       bilateral breast cancer   Cancer Sister    Hypertension Brother     Social History   Socioeconomic History   Marital status: Divorced    Spouse name: Not on file  Number of children: Not on file   Years of education: Not on file   Highest education level: Not on file  Occupational History   Not on file  Tobacco Use   Smoking status: Never   Smokeless tobacco: Never  Vaping Use   Vaping Use: Never used  Substance and Sexual Activity   Alcohol use: No    Alcohol/week: 0.0 standard drinks of alcohol   Drug use: No   Sexual activity: Not Currently    Partners: Male    Birth control/protection: Post-menopausal, Abstinence  Other Topics Concern   Not on file  Social History Narrative   Not on file   Social Determinants of Health   Financial Resource Strain: Not on file  Food Insecurity: Not on file  Transportation Needs: Not on file  Physical Activity: Not on file  Stress: Not on file  Social Connections: Not on file  Intimate Partner Violence: Not on file    Review of Systems  PHYSICAL EXAMINATION:    LMP 06/28/2010     General appearance: alert, cooperative and appears stated age Head: Normocephalic, without obvious abnormality,  atraumatic Neck: no adenopathy, supple, symmetrical, trachea midline and thyroid normal to inspection and palpation Lungs: clear to auscultation bilaterally Breasts: normal appearance, no masses or tenderness, No nipple retraction or dimpling, No nipple discharge or bleeding, No axillary or supraclavicular adenopathy Heart: regular rate and rhythm Abdomen: soft, non-tender, no masses,  no organomegaly Extremities: extremities normal, atraumatic, no cyanosis or edema Skin: Skin color, texture, turgor normal. No rashes or lesions Lymph nodes: Cervical, supraclavicular, and axillary nodes normal. No abnormal inguinal nodes palpated Neurologic: Grossly normal  Pelvic: External genitalia:  no lesions              Urethra:  normal appearing urethra with no masses, tenderness or lesions              Bartholins and Skenes: normal                 Vagina: normal appearing vagina with normal color and discharge, no lesions              Cervix: no lesions                Bimanual Exam:  Uterus:  normal size, contour, position, consistency, mobility, non-tender              Adnexa: no mass, fullness, tenderness              Rectal exam: {yes no:[id number]}.  Confirms.              Anus:  normal sphincter tone, no lesions  Chaperone was present for exam:  ***  ASSESSMENT     PLAN     An After Visit Summary was printed and given to the patient.  ______ minutes face to face time of which over 50% was spent in counseling.

## 2023-01-20 ENCOUNTER — Ambulatory Visit (INDEPENDENT_AMBULATORY_CARE_PROVIDER_SITE_OTHER): Payer: BC Managed Care – PPO | Admitting: Obstetrics and Gynecology

## 2023-01-20 ENCOUNTER — Other Ambulatory Visit (HOSPITAL_COMMUNITY)
Admission: RE | Admit: 2023-01-20 | Discharge: 2023-01-20 | Disposition: A | Discharge status: Home / Self Care | Payer: BC Managed Care – PPO | Source: Ambulatory Visit | Attending: Obstetrics and Gynecology | Admitting: Obstetrics and Gynecology

## 2023-01-20 ENCOUNTER — Encounter: Payer: Self-pay | Admitting: Obstetrics and Gynecology

## 2023-01-20 VITALS — BP 122/82 | HR 74 | Ht <= 58 in | Wt 104.0 lb

## 2023-01-20 DIAGNOSIS — Z79810 Long term (current) use of selective estrogen receptor modulators (SERMs): Secondary | ICD-10-CM

## 2023-01-20 DIAGNOSIS — R87618 Other abnormal cytological findings on specimens from cervix uteri: Secondary | ICD-10-CM | POA: Diagnosis not present

## 2023-01-20 DIAGNOSIS — N858 Other specified noninflammatory disorders of uterus: Secondary | ICD-10-CM | POA: Diagnosis not present

## 2023-01-20 NOTE — Patient Instructions (Signed)
Endometrial Biopsy  An endometrial biopsy is a procedure to remove tissue samples from the endometrium, which is the lining of the uterus. The tissue that is removed can then be checked under a microscope for disease. This procedure is used to diagnose conditions such as endometrial cancer, endometrial tuberculosis, polyps, or other inflammatory conditions. This procedure may also be used to investigate uterine bleeding to determine where you are in your menstrual cycle or how your hormone levels are affecting the lining of the uterus. Tell a health care provider about: Any allergies you have. All medicines you are taking, including vitamins, herbs, eye drops, creams, and over-the-counter medicines. Any problems you or family members have had with anesthetic medicines. Any bleeding problems you have. Any surgeries you have had. Any medical conditions you have. Whether you are pregnant or may be pregnant. What are the risks? Your health care provider will talk with you about risks. These may include: Bleeding. Pelvic infection. Puncture of the wall of the uterus with the biopsy device (rare). Allergic reactions to medicines. What happens before the procedure? Keep a record of your menstrual cycles as told by your health care provider. You may need to schedule your procedure for a specific time in your cycle. Bring a sanitary pad in case you need to wear one after the procedure. Ask your health care provider about: Changing or stopping your regular medicines. These include any diabetes medicines or blood thinners you take. Taking medicines such as aspirin and ibuprofen. These medicines can thin your blood. Do not take these medicines unless your health care provider tells you to. Taking over-the-counter medicines, vitamins, herbs, and supplements. Plan to have someone take you home from the hospital or clinic. What happens during the procedure? You will lie on an exam table with your feet  and legs supported as in a pelvic exam. Your health care provider will insert an instrument into your vagina to see your cervix. Your cervix will be cleansed with an antiseptic solution. A medicine (local anesthetic) will be used to numb the cervix. A forceps instrument will be used to hold your cervix steady for the biopsy. A thin, rod-like instrument (uterine sound) will be inserted through your cervix to determine the length of your uterus and the location where the biopsy sample will be removed. A thin, flexible tube (catheter) will be inserted through your cervix and into the uterus. The catheter will be used to collect the biopsy sample from your endometrial tissue. The tube and instruments will be removed, and the tissue sample will be sent to a lab for examination. The procedure may vary among health care providers and hospitals. What happens after the procedure? Your blood pressure, heart rate, breathing rate, and blood oxygen level will be monitored until you leave the hospital or clinic. It is up to you to get the results of your procedure. Ask your health care provider, or the department that is doing the procedure, when your results will be ready. Summary An endometrial biopsy is a procedure to remove tissue samples from the endometrium, which is the lining of the uterus. This procedure is used to diagnose conditions such as endometrial cancer, endometrial tuberculosis, polyps, or other inflammatory conditions. It is up to you to get the results of your procedure. Ask your health care provider, or the department that is doing the procedure, when your results will be ready. This information is not intended to replace advice given to you by your health care provider. Make sure you   discuss any questions you have with your health care provider. Document Revised: 01/29/2022 Document Reviewed: 01/29/2022 Elsevier Patient Education  2023 Elsevier Inc.  

## 2023-01-23 LAB — SURGICAL PATHOLOGY

## 2023-01-30 ENCOUNTER — Other Ambulatory Visit: Payer: BC Managed Care – PPO

## 2023-01-30 ENCOUNTER — Other Ambulatory Visit: Payer: BC Managed Care – PPO | Admitting: Obstetrics and Gynecology

## 2023-02-27 NOTE — Progress Notes (Signed)
GYNECOLOGY  VISIT   HPI: 67 y.o.   Divorced  Asian  female   G1P1001 with Patient's last menstrual period was 06/28/2010.   here for   U/S consult due to endometrial cells on pap.  No bleeding.   EMB 01/20/23 showed scant benign endometrium, negative for hyperplasia and malignancy.   GYNECOLOGIC HISTORY: Patient's last menstrual period was 06/28/2010. Contraception:  PMP Menopausal hormone therapy:  n/a Last mammogram:  11/14/22 Breast Density Cat C, BI-RADS CAT 2 benign Last pap smear:   12/02/22 neg: HR HPV neg, endometrial cells noted, 06/17/18 neg: HR HPV neg        OB History     Gravida  1   Para  1   Term  1   Preterm      AB      Living  1      SAB      IAB      Ectopic      Multiple      Live Births                 Patient Active Problem List   Diagnosis Date Noted   Ductal carcinoma in situ (DCIS) of left breast 12/28/2021   History of breast cancer 06/24/2014   Malignant neoplasm of breast (female), unspecified site 06/24/2014   Genuine stress incontinence, female 04/18/2014   Overactive bladder 04/18/2014    Past Medical History:  Diagnosis Date   Cancer (HCC) 2005   breast cancer, right   Diabetes mellitus without complication (HCC)    GERD (gastroesophageal reflux disease)    Hypercholesteremia    Hypertension    Osteoporosis    Personal history of radiation therapy 2005   Right Breast Cancer   Unexplained endometrial cells on cervical Pap smear 2024   Urinary incontinence     Past Surgical History:  Procedure Laterality Date   BREAST LUMPECTOMY Right 2005   BREAST LUMPECTOMY WITH RADIOACTIVE SEED LOCALIZATION Left 01/17/2022   Procedure: LEFT BREAST LUMPECTOMY WITH RADIOACTIVE SEED LOCALIZATION;  Surgeon: Emelia Loron, MD;  Location: [facility];  Service: General;  Laterality: Left;   BREAST SURGERY Right 2005   lumpectomy   CESAREAN SECTION  1991    Current Outpatient Medications  Medication Sig Dispense  Refill   Acetaminophen (TYLENOL 8 HOUR PO) Take by mouth daily as needed.     amLODipine (NORVASC) 10 MG tablet Take 1 tablet by mouth daily.     calcium carbonate (TUMS EX) 750 MG chewable tablet Chew 1 tablet by mouth daily as needed.     Cholecalciferol (VITAMIN D PO) Take 2,000 Int'l Units by mouth.     Cyanocobalamin (VITAMIN B 12) 500 MCG TABS Take 1 tablet by mouth daily.     ezetimibe (ZETIA) 10 MG tablet Take 10 mg by mouth daily.     famotidine (PEPCID) 20 MG tablet Take 20 mg by mouth 2 (two) times daily.     metFORMIN (GLUCOPHAGE) 500 MG tablet Take 1 tablet by mouth 2 (two) times daily.     tamoxifen (NOLVADEX) 20 MG tablet Take 1 tablet (20 mg total) by mouth daily. 90 tablet 3   TOPROL XL 100 MG 24 hr tablet Take 1 tablet by mouth daily.     COLLAGEN PO Take by mouth. (Patient not taking: Reported on 03/13/2023)     No current facility-administered medications for this visit.     ALLERGIES: Alendronate sodium, Statins, and Telmisartan  Family  History  Problem Relation Age of Onset   Heart disease Mother    Diabetes Father    Breast cancer Sister 66       bilateral breast cancer   Cancer Sister    Hypertension Brother     Social History   Socioeconomic History   Marital status: Divorced    Spouse name: Not on file   Number of children: Not on file   Years of education: Not on file   Highest education level: Not on file  Occupational History   Not on file  Tobacco Use   Smoking status: Never   Smokeless tobacco: Never  Vaping Use   Vaping Use: Never used  Substance and Sexual Activity   Alcohol use: No    Alcohol/week: 0.0 standard drinks of alcohol   Drug use: No   Sexual activity: Not Currently    Partners: Male    Birth control/protection: Post-menopausal, Abstinence  Other Topics Concern   Not on file  Social History Narrative   Not on file   Social Determinants of Health   Financial Resource Strain: Not on file  Food Insecurity: Not on file   Transportation Needs: Not on file  Physical Activity: Not on file  Stress: Not on file  Social Connections: Not on file  Intimate Partner Violence: Not on file    Review of Systems  All other systems reviewed and are negative.   PHYSICAL EXAMINATION:    BP 128/72 (BP Location: Right Arm, Patient Position: Sitting, Cuff Size: Normal)   Pulse 61   Ht 4' 9.5" (1.461 m)   Wt 104 lb (47.2 kg)   LMP 06/28/2010   SpO2 98%   BMI 22.12 kg/m     General appearance: alert, cooperative and appears stated age   Pelvic US Uterus 5.4 x 3.29 x 2.22 cm.  No myometrial masses.  EMS 2.31 mm with small amount of fluid noted.  No mass or thickening . Left ovary 1.39 x 0.95 x 0.65 cm.  Right ovary 1.20 x 0.74 x 0.66 cm.  No adnexal mass.  No free fluid.   ASSESSMENT  Endometrial cells on pap, otherwise normal and negative HR HPV. Normal endometrial biopsy.   PLAN  Pelvic US images and report reviewed.  Reassurance given.  Call for any postmenopausal bleeding.  Fu for annual exam in Feb. 2025 and prn.    10 min  total time was spent for this patient encounter, including preparation, face-to-face counseling with the patient, coordination of care, and documentation of the encounter.

## 2023-03-13 ENCOUNTER — Ambulatory Visit (INDEPENDENT_AMBULATORY_CARE_PROVIDER_SITE_OTHER): Payer: BC Managed Care – PPO

## 2023-03-13 ENCOUNTER — Ambulatory Visit (INDEPENDENT_AMBULATORY_CARE_PROVIDER_SITE_OTHER): Payer: BC Managed Care – PPO | Admitting: Obstetrics and Gynecology

## 2023-03-13 ENCOUNTER — Encounter: Payer: Self-pay | Admitting: Obstetrics and Gynecology

## 2023-03-13 VITALS — BP 128/72 | HR 61 | Ht <= 58 in | Wt 104.0 lb

## 2023-03-13 DIAGNOSIS — R87618 Other abnormal cytological findings on specimens from cervix uteri: Secondary | ICD-10-CM | POA: Diagnosis not present

## 2023-03-17 ENCOUNTER — Other Ambulatory Visit: Payer: Self-pay | Admitting: Hematology and Oncology

## 2023-04-11 DIAGNOSIS — E785 Hyperlipidemia, unspecified: Secondary | ICD-10-CM | POA: Diagnosis not present

## 2023-04-11 DIAGNOSIS — M810 Age-related osteoporosis without current pathological fracture: Secondary | ICD-10-CM | POA: Diagnosis not present

## 2023-04-11 DIAGNOSIS — I10 Essential (primary) hypertension: Secondary | ICD-10-CM | POA: Diagnosis not present

## 2023-04-11 DIAGNOSIS — E119 Type 2 diabetes mellitus without complications: Secondary | ICD-10-CM | POA: Diagnosis not present

## 2023-04-11 DIAGNOSIS — K219 Gastro-esophageal reflux disease without esophagitis: Secondary | ICD-10-CM | POA: Diagnosis not present

## 2023-04-18 DIAGNOSIS — Z0000 Encounter for general adult medical examination without abnormal findings: Secondary | ICD-10-CM | POA: Diagnosis not present

## 2023-04-18 DIAGNOSIS — Z1331 Encounter for screening for depression: Secondary | ICD-10-CM | POA: Diagnosis not present

## 2023-04-18 DIAGNOSIS — I10 Essential (primary) hypertension: Secondary | ICD-10-CM | POA: Diagnosis not present

## 2023-04-18 DIAGNOSIS — R82998 Other abnormal findings in urine: Secondary | ICD-10-CM | POA: Diagnosis not present

## 2023-04-18 DIAGNOSIS — Z1339 Encounter for screening examination for other mental health and behavioral disorders: Secondary | ICD-10-CM | POA: Diagnosis not present

## 2023-05-20 DIAGNOSIS — Z1211 Encounter for screening for malignant neoplasm of colon: Secondary | ICD-10-CM | POA: Diagnosis not present

## 2023-05-28 ENCOUNTER — Other Ambulatory Visit: Payer: Self-pay | Admitting: Oncology

## 2023-05-28 DIAGNOSIS — Z006 Encounter for examination for normal comparison and control in clinical research program: Secondary | ICD-10-CM

## 2023-05-28 DIAGNOSIS — H04123 Dry eye syndrome of bilateral lacrimal glands: Secondary | ICD-10-CM | POA: Diagnosis not present

## 2023-05-28 DIAGNOSIS — H25013 Cortical age-related cataract, bilateral: Secondary | ICD-10-CM | POA: Diagnosis not present

## 2023-05-28 DIAGNOSIS — H524 Presbyopia: Secondary | ICD-10-CM | POA: Diagnosis not present

## 2023-05-28 DIAGNOSIS — E119 Type 2 diabetes mellitus without complications: Secondary | ICD-10-CM | POA: Diagnosis not present

## 2023-05-28 DIAGNOSIS — H2513 Age-related nuclear cataract, bilateral: Secondary | ICD-10-CM | POA: Diagnosis not present

## 2023-05-28 DIAGNOSIS — H52203 Unspecified astigmatism, bilateral: Secondary | ICD-10-CM | POA: Diagnosis not present

## 2023-05-30 ENCOUNTER — Other Ambulatory Visit (HOSPITAL_COMMUNITY)
Admission: RE | Admit: 2023-05-30 | Discharge: 2023-05-30 | Disposition: A | Discharge status: Home / Self Care | Payer: Self-pay | Source: Ambulatory Visit | Attending: Oncology | Admitting: Oncology

## 2023-05-30 DIAGNOSIS — Z006 Encounter for examination for normal comparison and control in clinical research program: Secondary | ICD-10-CM | POA: Insufficient documentation

## 2023-07-18 DIAGNOSIS — D0512 Intraductal carcinoma in situ of left breast: Secondary | ICD-10-CM | POA: Diagnosis not present

## 2023-08-22 LAB — HELIX MOLECULAR SCREEN: Genetic Analysis Overall Interpretation: NEGATIVE

## 2023-10-03 ENCOUNTER — Other Ambulatory Visit: Payer: Self-pay | Admitting: Obstetrics and Gynecology

## 2023-10-03 DIAGNOSIS — R928 Other abnormal and inconclusive findings on diagnostic imaging of breast: Secondary | ICD-10-CM

## 2023-11-21 ENCOUNTER — Ambulatory Visit
Admission: RE | Admit: 2023-11-21 | Discharge: 2023-11-21 | Disposition: A | Discharge status: Home / Self Care | Payer: BC Managed Care – PPO | Source: Ambulatory Visit | Attending: Obstetrics and Gynecology | Admitting: Obstetrics and Gynecology

## 2023-11-21 DIAGNOSIS — R928 Other abnormal and inconclusive findings on diagnostic imaging of breast: Secondary | ICD-10-CM

## 2023-11-21 DIAGNOSIS — Z853 Personal history of malignant neoplasm of breast: Secondary | ICD-10-CM | POA: Diagnosis not present

## 2023-11-23 ENCOUNTER — Encounter: Payer: Self-pay | Admitting: Obstetrics and Gynecology

## 2024-01-01 ENCOUNTER — Inpatient Hospital Stay: Payer: BC Managed Care – PPO | Attending: Hematology and Oncology | Admitting: Hematology and Oncology

## 2024-01-01 ENCOUNTER — Encounter: Payer: Self-pay | Admitting: *Deleted

## 2024-01-01 VITALS — BP 134/50 | HR 76 | Temp 97.9°F | Resp 18 | Wt 109.9 lb

## 2024-01-01 DIAGNOSIS — D0512 Intraductal carcinoma in situ of left breast: Secondary | ICD-10-CM | POA: Diagnosis not present

## 2024-01-01 DIAGNOSIS — Z79810 Long term (current) use of selective estrogen receptor modulators (SERMs): Secondary | ICD-10-CM | POA: Diagnosis not present

## 2024-01-01 NOTE — Progress Notes (Signed)
 Patient Care Team: Chilton Greathouse, MD as PCP - General (Internal Medicine) Lonie Peak, MD as Attending Physician (Radiation Oncology) Serena Croissant, MD as Consulting Physician (Hematology and Oncology) Emelia Loron, MD as Consulting Physician (General Surgery)  DIAGNOSIS:  Encounter Diagnosis  Name Primary?   Ductal carcinoma in situ (DCIS) of left breast Yes    SUMMARY OF ONCOLOGIC HISTORY: Oncology History  Ductal carcinoma in situ (DCIS) of left breast  2010 Miscellaneous   Prior history of right breast cancer treated with lumpectomy, radiation and tamoxifen for 5 years   11/30/2021 Initial Diagnosis   Screening mammogram detected left breast asymmetry and calcifications, on diagnostic mammogram: Indeterminate 5 mm group of calcifications retroareolar left breast (DCIS high-grade ER 20%, PR 2%, indeterminate 9 mm calcifications UOQ left breast (fibroadenoma with dystrophic calcs),   01/17/2022 Surgery   Left lumpectomy: High-grade DCIS with calcifications spanning 1 cm, resection margins are negative, ER 20%, PR 2%   01/17/2022 Cancer Staging   Staging form: Breast, AJCC 8th Edition - Pathologic stage from 01/17/2022: Stage 0 (pTis (DCIS), pN0, cM0, ER+, PR-, HER2-) - Signed by Loa Socks, NP on 06/25/2022 Stage prefix: Initial diagnosis   02/27/2022 - 03/27/2022 Radiation Therapy   Left breast, 3D technique, 40.05Gy/15 fractions with 6XFFF photons   Followed by Left breast boost, 3D technique, 10 Gy/5 fractions with 6X photons   03/2022 -  Anti-estrogen oral therapy   Tamoxifen x 5 years     CHIEF COMPLIANT: Follow-up on tamoxifen  HISTORY OF PRESENT ILLNESS:  History of Present Illness The patient, with a history of breast cancer, is currently on tamoxifen. She reports occasional aches, pains, and cramps, which she is unsure if they are side effects of the medication. The patient also experiences occasional breast tenderness. Despite these symptoms,  the patient reports that she is doing well overall and the side effects are not interfering with her daily activities. The patient is still working but is planning to retire in the next year. She has plans to travel, including a potential trip to the [place] but expresses concern about the risk of blood clots associated with tamoxifen, especially during long flights.     ALLERGIES:  is allergic to alendronate sodium, statins, and telmisartan.  MEDICATIONS:  Current Outpatient Medications  Medication Sig Dispense Refill   Acetaminophen (TYLENOL 8 HOUR PO) Take by mouth daily as needed.     amLODipine (NORVASC) 10 MG tablet Take 1 tablet by mouth daily.     calcium carbonate (TUMS EX) 750 MG chewable tablet Chew 1 tablet by mouth daily as needed.     Cholecalciferol (VITAMIN D PO) Take 2,000 Int'l Units by mouth.     ezetimibe (ZETIA) 10 MG tablet Take 10 mg by mouth daily.     famotidine (PEPCID) 20 MG tablet Take 20 mg by mouth 2 (two) times daily.     metFORMIN (GLUCOPHAGE) 500 MG tablet Take 1 tablet by mouth 2 (two) times daily.     nebivolol (BYSTOLIC) 10 MG tablet 10 mg.     tamoxifen (NOLVADEX) 20 MG tablet TAKE 1 TABLET BY MOUTH EVERY DAY 90 tablet 3   COLLAGEN PO Take by mouth. (Patient not taking: Reported on 01/01/2024)     No current facility-administered medications for this visit.    PHYSICAL EXAMINATION: ECOG PERFORMANCE STATUS: 1 - Symptomatic but completely ambulatory  Vitals:   01/01/24 0903  BP: (!) 134/50  Pulse: 76  Resp: 18  Temp: 97.9 F (36.6 C)  SpO2: 97%   Filed Weights   01/01/24 0903  Weight: 109 lb 14.4 oz (49.9 kg)      LABORATORY DATA:  I have reviewed the data as listed    Latest Ref Rng & Units 01/11/2022    1:00 PM 10/31/2009    1:50 PM 03/15/2009    8:58 AM  CMP  Glucose 70 - 99 mg/dL 098  119  147   BUN 8 - 23 mg/dL 11  15  13    Creatinine 0.44 - 1.00 mg/dL 8.29  5.62  1.30   Sodium 135 - 145 mmol/L 137  141  140   Potassium 3.5 -  5.1 mmol/L 4.2  4.0  4.1   Chloride 98 - 111 mmol/L 101  104  106   CO2 22 - 32 mmol/L 25  27  24    Calcium 8.9 - 10.3 mg/dL 9.3  9.2  9.3   Total Protein 6.0 - 8.3 g/dL  6.8  6.9   Total Bilirubin 0.3 - 1.2 mg/dL  0.3  0.4   Alkaline Phos 39 - 117 U/L  66  64   AST 0 - 37 U/L  22  22   ALT 0 - 35 U/L  19  18     Lab Results  Component Value Date   WBC 6.0 10/31/2009   HGB 14.8 10/31/2009   HCT 42.6 10/31/2009   MCV 92.7 10/31/2009   PLT 245 10/31/2009   NEUTROABS 3.8 10/31/2009    ASSESSMENT & PLAN:  Ductal carcinoma in situ (DCIS) of left breast 01/17/2022:Left lumpectomy: High-grade DCIS with calcifications spanning 1 cm, resection margins are negative, ER 20%, PR 2% 02/28/2022-03/27/2022: Adjuvant radiation 03/28/2022: Tamoxifen once daily x5 years started 04/11/2022 (She previously took tamoxifen and had done extremely well)   Tamoxifen toxicities: Tolerating it fairly well. 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 Reports occasional cramps and tenderness in the breast but no other significant side effects. No hot flashes. -Continue Tamoxifen 20mg  daily. -Next follow-up in one year.  Risk of Thromboembolism Discussed the small risk of blood clots associated with Tamoxifen use. Patient is active and does not smoke or drink, reducing her risk. Discussed the need to stop Tamoxifen a week before any surgery or long flights. -Stop Tamoxifen one week prior to any surgery or long flights to reduce risk of thromboembolism.  Retirement Plans Patient plans to retire in early 2026 and is considering travel and  volunteering. -No specific medical plan related to this, but it was discussed and acknowledged.      No orders of the defined types were placed in this encounter.  The patient has a good understanding of the overall plan. she agrees with it. she will call with any problems that may develop before the next visit here. Total time spent: 30 mins including face to face time and time spent for planning, charting and co-ordination of care   Andrea Meek, MD 01/01/24

## 2024-01-01 NOTE — Assessment & Plan Note (Signed)
 01/17/2022:Left lumpectomy: High-grade DCIS with calcifications spanning 1 cm, resection margins are negative, ER 20%, PR 2% 02/28/2022-03/27/2022: Adjuvant radiation 03/28/2022: Tamoxifen once daily x5 years started 04/11/2022 (She previously took tamoxifen and had done extremely well)   Tamoxifen toxicities: Tolerating it fairly well. Endometrial biopsy 01/20/2023: Benign   Breast cancer surveillance: Mammogram 11/21/2023: Benign breast density category C   Return to clinic in 1 year for follow-up

## 2024-02-29 ENCOUNTER — Other Ambulatory Visit: Payer: Self-pay | Admitting: Hematology and Oncology

## 2024-03-09 NOTE — Progress Notes (Unsigned)
 68 y.o. G42P1001 Divorced [place] female here for annual exam.    Still has bladder issues.  Leaks with coughing, exercise.   Has used anticholinergics in the past, none currently.   Taking Tamoxifen .  No bleeding or spotting.   Working full time. Has a grand-daughter.  PCP: Avva, Ravisankar, MD   Patient's last menstrual period was 06/28/2010.           Sexually active: No.  The current method of family planning is post menopausal status.    Menopausal hormone therapy:  n/a Exercising: Yes.    Walking Smoker:  no  OB History  Gravida Para Term Preterm AB Living  1 1 1   1   SAB IAB Ectopic Multiple Live Births          # Outcome Date GA Lbr Len/2nd Weight Sex Type Anes PTL Lv  1 Term              HEALTH MAINTENANCE: Last 2 paps:  12/02/22 neg HR HPV neg, 06/17/18 neg HPV neg  History of abnormal Pap or positive HPV:  no Mammogram:   11/21/23 Breast Density Cat C, BIRADS Cat 2 benign  Colonoscopy:  03/03/10 Bone Density:  05/05/20  Result  osteoporotic PCP following    Immunization History  Administered Date(s) Administered   Influenza Split 08/03/2013, 12/20/2013, 03/30/2014   PFIZER(Purple Top)SARS-COV-2 Vaccination 12/17/2019, 01/11/2020   Pneumococcal Polysaccharide-23 08/03/2013   Tdap 07/28/2008, 08/20/2010, 09/01/2019      reports that she has never smoked. She has never used smokeless tobacco. She reports that she does not drink alcohol  and does not use drugs.  Past Medical History:  Diagnosis Date   Cancer [facility]) 2005   breast cancer, right   Diabetes mellitus without complication (HCC)    GERD (gastroesophageal reflux disease)    Hypercholesteremia    Hypertension    Osteoporosis    Personal history of radiation therapy 2005   Right Breast Cancer   Unexplained endometrial cells on cervical Pap smear 2024   Urinary incontinence     Past Surgical History:  Procedure Laterality Date   BREAST LUMPECTOMY Right 2005   BREAST LUMPECTOMY WITH RADIOACTIVE  SEED LOCALIZATION Left 01/17/2022   Procedure: LEFT BREAST LUMPECTOMY WITH RADIOACTIVE SEED LOCALIZATION;  Surgeon: Enid Harry, MD;  Location: [facility];  Service: General;  Laterality: Left;   BREAST SURGERY Right 2005   lumpectomy   CESAREAN SECTION  1991    Current Outpatient Medications  Medication Sig Dispense Refill   Acetaminophen  (TYLENOL  8 HOUR PO) Take by mouth daily as needed.     amLODipine (NORVASC) 10 MG tablet Take 1 tablet by mouth daily.     calcium carbonate (TUMS EX) 750 MG chewable tablet Chew 1 tablet by mouth daily as needed.     Cholecalciferol (VITAMIN D  PO) Take 2,000 Int'l Units by mouth.     Cholecalciferol (VITAMIN D -3 PO) Take by mouth.     Cyanocobalamin (VITAMIN B-12 PO) Take by mouth.     ezetimibe (ZETIA) 10 MG tablet Take 10 mg by mouth daily.     famotidine (PEPCID) 20 MG tablet Take 20 mg by mouth 2 (two) times daily.     metFORMIN (GLUCOPHAGE) 500 MG tablet Take 1 tablet by mouth 2 (two) times daily.     nebivolol (BYSTOLIC) 10 MG tablet 10 mg.     tamoxifen  (NOLVADEX ) 20 MG tablet TAKE 1 TABLET BY MOUTH EVERY DAY 90 tablet 3   No  current facility-administered medications for this visit.    ALLERGIES: Alendronate sodium, Statins, and Telmisartan  Family History  Problem Relation Age of Onset   Heart disease Mother    Diabetes Father    Breast cancer Sister 39       bilateral breast cancer   Cancer Sister    Hypertension Brother     Review of Systems  All other systems reviewed and are negative.   PHYSICAL EXAM:  BP 116/70 (BP Location: Left Arm, Patient Position: Sitting)   Pulse 73   Ht 4\' 10"  (1.473 m)   Wt 109 lb (49.4 kg)   LMP 06/28/2010   SpO2 97%   BMI 22.78 kg/m     General appearance: alert, cooperative and appears stated age Head: normocephalic, without obvious abnormality, atraumatic Neck: no adenopathy, supple, symmetrical, trachea midline and thyroid normal to inspection and palpation Lungs:  clear to auscultation bilaterally Breasts: scars noted, no masses or tenderness, No nipple retraction or dimpling, No nipple discharge or bleeding, No axillary adenopathy Heart: regular rate and rhythm Abdomen: soft, non-tender; no masses, no organomegaly Extremities: extremities normal, atraumatic, no cyanosis or edema Skin: skin color, texture, turgor normal. No rashes or lesions Lymph nodes: cervical, supraclavicular, and axillary nodes normal. Neurologic: grossly normal  Pelvic: External genitalia:  no lesions              No abnormal inguinal nodes palpated.              Urethra:  normal appearing urethra with no masses, tenderness or lesions              Bartholins and Skenes: normal                 Vagina: normal appearing vagina with normal color and discharge, no lesions              Cervix: no lesions              Pap taken: no Bimanual Exam:  Uterus:  normal size, contour, position, consistency, mobility, non-tender              Adnexa: no mass, fullness, tenderness              Rectal exam: yes.  Confirms.              Anus:  normal sphincter tone, no lesions  Chaperone was present for exam:  Cottie Diss, CMA  ASSESSMENT: Well woman visit with gynecologic exam. Remote hx of condyloma.  Hx right breast cancer.  Status post lumpectomy and XRT. Hx left breast DCIS.   Status post lumpectomy and XRT. On Tamoxifen .  Osteoporosis. Severe osteoporosis of the spine in 2021. Off Prolia . Mixed incontinence.  PHQ-9: 0  PLAN: Mammogram screening discussed. Self breast awareness reviewed. Pap and HRV collected:  no.  Due in 2029.  Guidelines for Calcium, Vitamin D , regular exercise program including cardiovascular and weight bearing exercise. Medication refills:  NA We reviewed her BMD from 2021 and her increased risk of fracture.  She will discuss with her PCP ordering another BMD. Labs with PCP. Follow up:  yearly and prn.

## 2024-03-10 ENCOUNTER — Encounter: Payer: Self-pay | Admitting: Obstetrics and Gynecology

## 2024-03-10 ENCOUNTER — Ambulatory Visit (INDEPENDENT_AMBULATORY_CARE_PROVIDER_SITE_OTHER): Payer: BC Managed Care – PPO | Admitting: Obstetrics and Gynecology

## 2024-03-10 VITALS — BP 116/70 | HR 73 | Ht <= 58 in | Wt 109.0 lb

## 2024-03-10 DIAGNOSIS — Z1331 Encounter for screening for depression: Secondary | ICD-10-CM

## 2024-03-10 DIAGNOSIS — Z01419 Encounter for gynecological examination (general) (routine) without abnormal findings: Secondary | ICD-10-CM

## 2024-03-10 NOTE — Patient Instructions (Signed)

## 2024-03-11 NOTE — Addendum Note (Signed)
 Addended by: Jorie Newness, Colonel Dears E on: 03/11/2024 09:19 PM   Modules accepted: Level of Service

## 2024-04-16 DIAGNOSIS — E119 Type 2 diabetes mellitus without complications: Secondary | ICD-10-CM | POA: Diagnosis not present

## 2024-04-16 DIAGNOSIS — E785 Hyperlipidemia, unspecified: Secondary | ICD-10-CM | POA: Diagnosis not present

## 2024-04-16 DIAGNOSIS — M810 Age-related osteoporosis without current pathological fracture: Secondary | ICD-10-CM | POA: Diagnosis not present

## 2024-04-22 DIAGNOSIS — E119 Type 2 diabetes mellitus without complications: Secondary | ICD-10-CM | POA: Diagnosis not present

## 2024-04-22 DIAGNOSIS — Z0000 Encounter for general adult medical examination without abnormal findings: Secondary | ICD-10-CM | POA: Diagnosis not present

## 2024-04-22 DIAGNOSIS — Z1339 Encounter for screening examination for other mental health and behavioral disorders: Secondary | ICD-10-CM | POA: Diagnosis not present

## 2024-04-22 DIAGNOSIS — I10 Essential (primary) hypertension: Secondary | ICD-10-CM | POA: Diagnosis not present

## 2024-04-22 DIAGNOSIS — Z1331 Encounter for screening for depression: Secondary | ICD-10-CM | POA: Diagnosis not present

## 2024-04-22 DIAGNOSIS — R82998 Other abnormal findings in urine: Secondary | ICD-10-CM | POA: Diagnosis not present

## 2024-05-28 DIAGNOSIS — E119 Type 2 diabetes mellitus without complications: Secondary | ICD-10-CM | POA: Diagnosis not present

## 2024-05-28 DIAGNOSIS — H2513 Age-related nuclear cataract, bilateral: Secondary | ICD-10-CM | POA: Diagnosis not present

## 2024-07-30 ENCOUNTER — Other Ambulatory Visit: Payer: Self-pay | Admitting: General Surgery

## 2024-07-30 DIAGNOSIS — Z0000 Encounter for general adult medical examination without abnormal findings: Secondary | ICD-10-CM

## 2024-07-30 DIAGNOSIS — D0512 Intraductal carcinoma in situ of left breast: Secondary | ICD-10-CM | POA: Diagnosis not present

## 2024-11-26 ENCOUNTER — Ambulatory Visit
Admission: RE | Admit: 2024-11-26 | Discharge: 2024-11-26 | Disposition: A | Source: Ambulatory Visit | Attending: General Surgery | Admitting: General Surgery

## 2024-11-26 DIAGNOSIS — Z0000 Encounter for general adult medical examination without abnormal findings: Secondary | ICD-10-CM

## 2024-12-01 ENCOUNTER — Other Ambulatory Visit: Payer: Self-pay | Admitting: Obstetrics and Gynecology

## 2024-12-01 DIAGNOSIS — R928 Other abnormal and inconclusive findings on diagnostic imaging of breast: Secondary | ICD-10-CM

## 2024-12-10 ENCOUNTER — Encounter

## 2025-01-06 ENCOUNTER — Ambulatory Visit: Admitting: Hematology and Oncology

## 2025-03-14 ENCOUNTER — Ambulatory Visit: Admitting: Obstetrics and Gynecology

## 2025-03-17 ENCOUNTER — Ambulatory Visit: Admitting: Obstetrics and Gynecology
